# Patient Record
Sex: Female | Born: 1955 | Race: White | Hispanic: No | Marital: Married | Smoking: Former smoker
Health system: Southern US, Community
[De-identification: ages and names within clinical notes are randomized; demographics above are authoritative.]

## PROBLEM LIST (undated history)

## (undated) DIAGNOSIS — E079 Disorder of thyroid, unspecified: Secondary | ICD-10-CM

## (undated) DIAGNOSIS — Z8619 Personal history of other infectious and parasitic diseases: Secondary | ICD-10-CM

## (undated) DIAGNOSIS — R002 Palpitations: Secondary | ICD-10-CM

## (undated) DIAGNOSIS — I10 Essential (primary) hypertension: Secondary | ICD-10-CM

## (undated) DIAGNOSIS — E785 Hyperlipidemia, unspecified: Secondary | ICD-10-CM

## (undated) DIAGNOSIS — M25551 Pain in right hip: Secondary | ICD-10-CM

## (undated) DIAGNOSIS — M21611 Bunion of right foot: Secondary | ICD-10-CM

## (undated) DIAGNOSIS — K5792 Diverticulitis of intestine, part unspecified, without perforation or abscess without bleeding: Secondary | ICD-10-CM

## (undated) DIAGNOSIS — Z789 Other specified health status: Secondary | ICD-10-CM

## (undated) DIAGNOSIS — Z0000 Encounter for general adult medical examination without abnormal findings: Principal | ICD-10-CM

## (undated) DIAGNOSIS — N952 Postmenopausal atrophic vaginitis: Secondary | ICD-10-CM

## (undated) DIAGNOSIS — E559 Vitamin D deficiency, unspecified: Secondary | ICD-10-CM

## (undated) DIAGNOSIS — N951 Menopausal and female climacteric states: Secondary | ICD-10-CM

## (undated) DIAGNOSIS — Z531 Procedure and treatment not carried out because of patient's decision for reasons of belief and group pressure: Secondary | ICD-10-CM

## (undated) DIAGNOSIS — E663 Overweight: Secondary | ICD-10-CM

## (undated) DIAGNOSIS — M25512 Pain in left shoulder: Secondary | ICD-10-CM

## (undated) HISTORY — DX: Disorder of thyroid, unspecified: E07.9

## (undated) HISTORY — DX: Diverticulitis of intestine, part unspecified, without perforation or abscess without bleeding: K57.92

## (undated) HISTORY — DX: Encounter for general adult medical examination without abnormal findings: Z00.00

## (undated) HISTORY — DX: Essential (primary) hypertension: I10

## (undated) HISTORY — DX: Overweight: E66.3

## (undated) HISTORY — DX: Vitamin D deficiency, unspecified: E55.9

## (undated) HISTORY — PX: WISDOM TOOTH EXTRACTION: SHX21

## (undated) HISTORY — DX: Palpitations: R00.2

## (undated) HISTORY — DX: Hyperlipidemia, unspecified: E78.5

## (undated) HISTORY — DX: Postmenopausal atrophic vaginitis: N95.2

## (undated) HISTORY — DX: Personal history of other infectious and parasitic diseases: Z86.19

## (undated) HISTORY — DX: Other specified health status: Z78.9

## (undated) HISTORY — DX: Pain in left shoulder: M25.512

## (undated) HISTORY — PX: PARTIAL HIP ARTHROPLASTY: SHX733

## (undated) HISTORY — PX: COLONOSCOPY: SHX174

## (undated) HISTORY — DX: Bunion of right foot: M21.611

## (undated) HISTORY — DX: Procedure and treatment not carried out because of patient's decision for reasons of belief and group pressure: Z53.1

## (undated) HISTORY — DX: Pain in right hip: M25.551

## (undated) HISTORY — DX: Menopausal and female climacteric states: N95.1

---

## 2013-09-04 LAB — HM COLONOSCOPY

## 2014-10-20 LAB — HM MAMMOGRAPHY: HM MAMMO: NEGATIVE

## 2014-12-21 LAB — HM PAP SMEAR

## 2015-06-07 ENCOUNTER — Encounter: Payer: Self-pay | Admitting: Family Medicine

## 2015-06-07 ENCOUNTER — Ambulatory Visit (INDEPENDENT_AMBULATORY_CARE_PROVIDER_SITE_OTHER): Payer: No Typology Code available for payment source | Admitting: Family Medicine

## 2015-06-07 VITALS — BP 128/72 | HR 88 | Temp 98.6°F | Ht 67.0 in | Wt 190.0 lb

## 2015-06-07 DIAGNOSIS — R002 Palpitations: Secondary | ICD-10-CM

## 2015-06-07 DIAGNOSIS — Z8619 Personal history of other infectious and parasitic diseases: Secondary | ICD-10-CM | POA: Diagnosis not present

## 2015-06-07 DIAGNOSIS — E663 Overweight: Secondary | ICD-10-CM

## 2015-06-07 DIAGNOSIS — Z531 Procedure and treatment not carried out because of patient's decision for reasons of belief and group pressure: Secondary | ICD-10-CM

## 2015-06-07 DIAGNOSIS — IMO0001 Reserved for inherently not codable concepts without codable children: Secondary | ICD-10-CM

## 2015-06-07 DIAGNOSIS — I10 Essential (primary) hypertension: Secondary | ICD-10-CM | POA: Insufficient documentation

## 2015-06-07 DIAGNOSIS — E785 Hyperlipidemia, unspecified: Secondary | ICD-10-CM

## 2015-06-07 DIAGNOSIS — E559 Vitamin D deficiency, unspecified: Secondary | ICD-10-CM

## 2015-06-07 DIAGNOSIS — Z789 Other specified health status: Secondary | ICD-10-CM

## 2015-06-07 DIAGNOSIS — E079 Disorder of thyroid, unspecified: Secondary | ICD-10-CM | POA: Insufficient documentation

## 2015-06-07 DIAGNOSIS — M25551 Pain in right hip: Secondary | ICD-10-CM

## 2015-06-07 DIAGNOSIS — N951 Menopausal and female climacteric states: Secondary | ICD-10-CM

## 2015-06-07 DIAGNOSIS — Z780 Asymptomatic menopausal state: Secondary | ICD-10-CM

## 2015-06-07 DIAGNOSIS — R0789 Other chest pain: Secondary | ICD-10-CM

## 2015-06-07 HISTORY — DX: Other specified health status: Z78.9

## 2015-06-07 HISTORY — DX: Reserved for inherently not codable concepts without codable children: IMO0001

## 2015-06-07 HISTORY — DX: Procedure and treatment not carried out because of patient's decision for reasons of belief and group pressure: Z53.1

## 2015-06-07 HISTORY — DX: Vitamin D deficiency, unspecified: E55.9

## 2015-06-07 HISTORY — DX: Palpitations: R00.2

## 2015-06-07 HISTORY — DX: Personal history of other infectious and parasitic diseases: Z86.19

## 2015-06-07 HISTORY — DX: Overweight: E66.3

## 2015-06-07 NOTE — Assessment & Plan Note (Addendum)
Takes Vitamin D 5000 IU bid, will need level checked

## 2015-06-07 NOTE — Progress Notes (Signed)
Patient ID: Megan Shah, female   DOB: February 08, 1956, 59 y.o.   MRN: [id number]   Subjective:    Patient ID: Megan Shah, female    DOB: 13-Dec-1955, 59 y.o.   MRN: [id number]  Chief Complaint  Patient presents with  . Hip Pain  . Tachycardia    HPI Patient is in today for new patient appointment. She is back to the area to help care for her ailing and aging parents. She reports generally being in good health but does offer some concerns. One is of hot flashes and irritability for which she uses bioidentical hormones with good relief. She's been struggling with right hip and right knee pain and has gotten some relief with chiropractic care. No recent trauma or injury. She acknowledges significant stress and has had some episodes of awakening at night with tachycardia. Has also had some atypical anterior chest pains although she's had none recently. No recent acute illness. Denies SOB/HA/congestion/fevers/GI or GU c/o. Taking meds as prescribed  Past Medical History  Diagnosis Date  . Diverticulitis   . Patient is Jehovah's Witness 06/07/2015  . Transfusion of blood product refused for religious reason 06/07/2015  . Overweight 06/07/2015  . History of chicken pox 06/07/2015  . Hypertension   . Hyperlipidemia   . Thyroid disease   . Vitamin D deficiency 06/07/2015  . Palpitations 06/07/2015  . Right hip pain 06/13/2015  . Post menopausal syndrome 06/13/2015    History reviewed. No pertinent past surgical history.  Family History  Problem Relation Age of Onset  . Arthritis Mother   . Hypertension Mother   . Hyperlipidemia Mother   . Heart disease Mother     CAD with stents, afib  . Atrial fibrillation Mother     flutter  . Arthritis Father     broken hip, sepsis   . Cancer Father     colon cancer, mirkel cell carcinoma  . Bunion Father   . Dementia Father   . Atrial fibrillation Father   . Arthritis Maternal Grandmother   . Arthritis Maternal Grandfather   . Cancer  Maternal Grandfather 67    colon cancer  . Arthritis Paternal Grandmother   . Arthritis Paternal Grandfather   . Cancer Paternal Grandfather     colon cancer  . Colon polyps Brother     non cancer polyp  . Cancer Brother     lymphoma in remission  . Atrial fibrillation Brother   . Alcohol abuse Son     Social History   Social History  . Marital Status: Married    Spouse Name: N/A  . Number of Children: N/A  . Years of Education: N/A   Occupational History  . volunteer    Social History Main Topics  . Smoking status: Former Research scientist (life sciences)  . Smokeless tobacco: Not on file     Comment: Smoked for 2 years as a teenager.  . Alcohol Use: 0.0 oz/week    0 Standard drinks or equivalent per week  . Drug Use: No  . Sexual Activity: Yes     Comment: lives with husband, moved from [place], no dietary restrictions, minimizes wheat and dairy, volunteer work   Other Topics Concern  . Not on file   Social History Narrative    No outpatient prescriptions prior to visit.   No facility-administered medications prior to visit.    No Known Allergies  Review of Systems  Constitutional: Negative for fever and malaise/fatigue.  HENT: Negative for congestion.   Eyes: Negative  for discharge.  Respiratory: Negative for shortness of breath.   Cardiovascular: Negative for chest pain, palpitations and leg swelling.  Gastrointestinal: Negative for nausea and abdominal pain.  Genitourinary: Negative for dysuria.  Musculoskeletal: Negative for falls.  Skin: Negative for rash.  Neurological: Negative for loss of consciousness and headaches.  Endo/Heme/Allergies: Negative for environmental allergies.  Psychiatric/Behavioral: Negative for depression. The patient is not nervous/anxious.        Objective:    Physical Exam  BP 128/72 mmHg  Pulse 88  Temp(Src) 98.6 F (37 C) (Oral)  Ht 5\' 7"  (1.702 m)  Wt 190 lb (86.183 kg)  BMI 29.75 kg/m2  SpO2 95% Wt Readings from Last 3 Encounters:    06/07/15 190 lb (86.183 kg)     No results found for: WBC, HGB, HCT, PLT, GLUCOSE, CHOL, TRIG, HDL, LDLDIRECT, LDLCALC, ALT, AST, NA, K, CL, CREATININE, BUN, CO2, TSH, PSA, INR, GLUF, HGBA1C, MICROALBUR      Assessment & Plan:   Problem List Items Addressed This Visit    Vitamin D deficiency    Takes Vitamin D 5000 IU bid, will need level checked      Thyroid disease    Previously managed by [facility], will await lab work      Relevant Medications   thyroid (ARMOUR) 60 MG tablet   Right hip pain    And right knee pain has improved with help of Dr Luvenia Heller, chiropractor. encouraged topical treatments and staying active.       Post menopausal syndrome    Chooses to use bio identicals:  15 gm Bi-EST (50/50) 5mg /ml cream Sig: apply 1-2 clicks (1/7-6/1YW) qd, rotate sites  30 gm Progesterone 200 mg/cc (20%) cream Sig: apply 3 clicks (7.37TG)      Patient is Jehovah's Witness    Declines blood products. Encouraged to provide [place] with advanced directives.      Palpitations    Reports waking up with tachycardia infrequently, report return of symptoms. Minimize caffeine and maximize rest      Relevant Orders   EKG 12-Lead (Completed)   Ambulatory referral to Cardiology   Overweight    Encouraged DASH diet, decrease po intake and increase exercise as tolerated. Needs 7-8 hours of sleep nightly. Avoid trans fats, eat small, frequent meals every 4-5 hours with lean proteins, complex carbs and healthy fats. Minimize simple carbs, GMO foods.      Hypertension    Well controlled, no changes to meds. Encouraged heart healthy diet such as the DASH diet and exercise as tolerated.       Relevant Medications   bisoprolol-hydrochlorothiazide (ZIAC) 2.5-6.25 MG tablet   aspirin 81 MG chewable tablet   Hyperlipidemia    Encouraged heart healthy diet, increase exercise, avoid trans fats, consider a krill oil cap daily      Relevant Medications    bisoprolol-hydrochlorothiazide (ZIAC) 2.5-6.25 MG tablet   aspirin 81 MG chewable tablet   History of chicken pox   Atypical chest pain - Primary    Nothing recently but is new to area and describes occasional chest pain. EKG normal today. Will consider referral if symptoms return         I am having Ms. Robins maintain her thyroid, bisoprolol-hydrochlorothiazide, and aspirin.  Meds ordered this encounter  Medications  . thyroid (ARMOUR) 60 MG tablet    Sig: Take 60 mg by mouth daily.  . bisoprolol-hydrochlorothiazide (ZIAC) 2.5-6.25 MG tablet    Sig: Take 1 tablet by mouth daily.  Marland Kitchen  aspirin 81 MG chewable tablet    Sig: Chew 81 mg by mouth daily.     Penni Homans, MD

## 2015-06-07 NOTE — Patient Instructions (Signed)
Minimize caffeine and sodium, get regular exercise and 6-8 hours of sleep   Hypertension Hypertension, commonly called high blood pressure, is when the force of blood pumping through your arteries is too strong. Your arteries are the blood vessels that carry blood from your heart throughout your body. A blood pressure reading consists of a higher number over a lower number, such as 110/72. The higher number (systolic) is the pressure inside your arteries when your heart pumps. The lower number (diastolic) is the pressure inside your arteries when your heart relaxes. Ideally you want your blood pressure below 120/80. Hypertension forces your heart to work harder to pump blood. Your arteries may become narrow or stiff. Having hypertension puts you at risk for heart disease, stroke, and other problems.  RISK FACTORS Some risk factors for high blood pressure are controllable. Others are not.  Risk factors you cannot control include:   Race. You may be at higher risk if you are African American.  Age. Risk increases with age.  Gender. Men are at higher risk than women before age 65 years. After age 22, women are at higher risk than men. Risk factors you can control include:  Not getting enough exercise or physical activity.  Being overweight.  Getting too much fat, sugar, calories, or salt in your diet.  Drinking too much alcohol. SIGNS AND SYMPTOMS Hypertension does not usually cause signs or symptoms. Extremely high blood pressure (hypertensive crisis) may cause headache, anxiety, shortness of breath, and nosebleed. DIAGNOSIS  To check if you have hypertension, your health care provider will measure your blood pressure while you are seated, with your arm held at the level of your heart. It should be measured at least twice using the same arm. Certain conditions can cause a difference in blood pressure between your right and left arms. A blood pressure reading that is higher than normal on one  occasion does not mean that you need treatment. If one blood pressure reading is high, ask your health care provider about having it checked again. TREATMENT  Treating high blood pressure includes making lifestyle changes and possibly taking medicine. Living a healthy lifestyle can help lower high blood pressure. You may need to change some of your habits. Lifestyle changes may include:  Following the DASH diet. This diet is high in fruits, vegetables, and whole grains. It is low in salt, red meat, and added sugars.  Getting at least 2 hours of brisk physical activity every week.  Losing weight if necessary.  Not smoking.  Limiting alcoholic beverages.  Learning ways to reduce stress. If lifestyle changes are not enough to get your blood pressure under control, your health care provider may prescribe medicine. You may need to take more than one. Work closely with your health care provider to understand the risks and benefits. HOME CARE INSTRUCTIONS  Have your blood pressure rechecked as directed by your health care provider.   Take medicines only as directed by your health care provider. Follow the directions carefully. Blood pressure medicines must be taken as prescribed. The medicine does not work as well when you skip doses. Skipping doses also puts you at risk for problems.   Do not smoke.   Monitor your blood pressure at home as directed by your health care provider. SEEK MEDICAL CARE IF:   You think you are having a reaction to medicines taken.  You have recurrent headaches or feel dizzy.  You have swelling in your ankles.  You have trouble with your  vision. SEEK IMMEDIATE MEDICAL CARE IF:  You develop a severe headache or confusion.  You have unusual weakness, numbness, or feel faint.  You have severe chest or abdominal pain.  You vomit repeatedly.  You have trouble breathing. MAKE SURE YOU:   Understand these instructions.  Will watch your  condition.  Will get help right away if you are not doing well or get worse. Document Released: 08/21/2005 Document Revised: 01/05/2014 Document Reviewed: 06/13/2013 [facility] Patient Information 2015 [place], [place]. This information is not intended to replace advice given to you by your health care provider. Make sure you discuss any questions you have with your health care provider.

## 2015-06-07 NOTE — Progress Notes (Signed)
Pre visit review using our clinic review tool, if applicable. No additional management support is needed unless otherwise documented below in the visit note. 

## 2015-06-13 ENCOUNTER — Encounter: Payer: Self-pay | Admitting: Family Medicine

## 2015-06-13 DIAGNOSIS — M25551 Pain in right hip: Secondary | ICD-10-CM | POA: Insufficient documentation

## 2015-06-13 DIAGNOSIS — N951 Menopausal and female climacteric states: Secondary | ICD-10-CM

## 2015-06-13 HISTORY — DX: Pain in right hip: M25.551

## 2015-06-13 HISTORY — DX: Menopausal and female climacteric states: N95.1

## 2015-06-13 NOTE — Assessment & Plan Note (Signed)
Declines blood products. Encouraged to provide [place] with advanced directives.

## 2015-06-13 NOTE — Assessment & Plan Note (Signed)
Well controlled, no changes to meds. Encouraged heart healthy diet such as the DASH diet and exercise as tolerated.  °

## 2015-06-13 NOTE — Assessment & Plan Note (Signed)
Encouraged heart healthy diet, increase exercise, avoid trans fats, consider a krill oil cap daily 

## 2015-06-13 NOTE — Assessment & Plan Note (Signed)
Encouraged DASH diet, decrease po intake and increase exercise as tolerated. Needs 7-8 hours of sleep nightly. Avoid trans fats, eat small, frequent meals every 4-5 hours with lean proteins, complex carbs and healthy fats. Minimize simple carbs, GMO foods. 

## 2015-06-13 NOTE — Assessment & Plan Note (Signed)
Nothing recently but is new to area and describes occasional chest pain. EKG normal today. Will consider referral if symptoms return

## 2015-06-13 NOTE — Assessment & Plan Note (Signed)
Chooses to use bio identicals:  15 gm Bi-EST (50/50) 5mg /ml cream Sig: apply 1-2 clicks (3/8-2/5KN) qd, rotate sites  30 gm Progesterone 200 mg/cc (20%) cream Sig: apply 3 clicks (3.97QB)

## 2015-06-13 NOTE — Assessment & Plan Note (Signed)
And right knee pain has improved with help of Dr Luvenia Heller, chiropractor. encouraged topical treatments and staying active.

## 2015-06-13 NOTE — Assessment & Plan Note (Signed)
Previously managed by [facility], will await lab work

## 2015-06-13 NOTE — Assessment & Plan Note (Signed)
Reports waking up with tachycardia infrequently, report return of symptoms. Minimize caffeine and maximize rest

## 2015-09-13 ENCOUNTER — Ambulatory Visit: Payer: No Typology Code available for payment source | Admitting: Family Medicine

## 2015-11-03 ENCOUNTER — Encounter: Payer: Self-pay | Admitting: *Deleted

## 2015-11-03 ENCOUNTER — Telehealth: Payer: Self-pay | Admitting: *Deleted

## 2015-11-03 LAB — HEPATITIS C ANTIBODY: HEP C AB: NEGATIVE

## 2015-11-03 NOTE — Telephone Encounter (Signed)
Pre-Visit Call completed with patient and chart updated.   Pre-Visit Info documented in Specialty Comments under [employer].    

## 2015-11-03 NOTE — Telephone Encounter (Signed)
Pt not available at time of call. She is in the car with a group of people. Requests call back after 4pm.

## 2015-11-04 ENCOUNTER — Encounter (INDEPENDENT_AMBULATORY_CARE_PROVIDER_SITE_OTHER): Payer: Self-pay

## 2015-11-04 ENCOUNTER — Encounter: Payer: Self-pay | Admitting: Family Medicine

## 2015-11-04 ENCOUNTER — Ambulatory Visit (INDEPENDENT_AMBULATORY_CARE_PROVIDER_SITE_OTHER): Payer: No Typology Code available for payment source | Admitting: Family Medicine

## 2015-11-04 VITALS — BP 112/82 | HR 78 | Temp 97.4°F | Ht 67.0 in | Wt 192.0 lb

## 2015-11-04 DIAGNOSIS — E559 Vitamin D deficiency, unspecified: Secondary | ICD-10-CM | POA: Diagnosis not present

## 2015-11-04 DIAGNOSIS — E785 Hyperlipidemia, unspecified: Secondary | ICD-10-CM

## 2015-11-04 DIAGNOSIS — I10 Essential (primary) hypertension: Secondary | ICD-10-CM

## 2015-11-04 DIAGNOSIS — E039 Hypothyroidism, unspecified: Secondary | ICD-10-CM | POA: Diagnosis not present

## 2015-11-04 DIAGNOSIS — R1012 Left upper quadrant pain: Secondary | ICD-10-CM

## 2015-11-04 DIAGNOSIS — M25551 Pain in right hip: Secondary | ICD-10-CM

## 2015-11-04 NOTE — Progress Notes (Signed)
Pre visit review using our clinic review tool, if applicable. No additional management support is needed unless otherwise documented below in the visit note. 

## 2015-11-04 NOTE — Patient Instructions (Addendum)
Preventive Care for Adults, Female A healthy lifestyle and preventive care can promote health and wellness. Preventive health guidelines for women include the following key practices.  A routine yearly physical is a good way to check with your health care provider about your health and preventive screening. It is a chance to share any concerns and updates on your health and to receive a thorough exam.  Visit your dentist for a routine exam and preventive care every 6 months. Brush your teeth twice a day and floss once a day. Good oral hygiene prevents tooth decay and gum disease.  The frequency of eye exams is based on your age, health, family medical history, use of contact lenses, and other factors. Follow your health care provider's recommendations for frequency of eye exams.  Eat a healthy diet. Foods like vegetables, fruits, whole grains, low-fat dairy products, and lean protein foods contain the nutrients you need without too many calories. Decrease your intake of foods high in solid fats, added sugars, and salt. Eat the right amount of calories for you.Get information about a proper diet from your health care provider, if necessary.  Regular physical exercise is one of the most important things you can do for your health. Most adults should get at least 150 minutes of moderate-intensity exercise (any activity that increases your heart rate and causes you to sweat) each week. In addition, most adults need muscle-strengthening exercises on 2 or more days a week.  Maintain a healthy weight. The body mass index (BMI) is a screening tool to identify possible weight problems. It provides an estimate of body fat based on height and weight. Your health care provider can find your BMI and can help you achieve or maintain a healthy weight.For adults 20 years and older:  A BMI below 18.5 is considered underweight.  A BMI of 18.5 to 24.9 is normal.  A BMI of 25 to 29.9 is considered overweight.  A  BMI of 30 and above is considered obese.  Maintain normal blood lipids and cholesterol levels by exercising and minimizing your intake of saturated fat. Eat a balanced diet with plenty of fruit and vegetables. Blood tests for lipids and cholesterol should begin at age 20 and be repeated every 5 years. If your lipid or cholesterol levels are high, you are over 50, or you are at high risk for heart disease, you may need your cholesterol levels checked more frequently.Ongoing high lipid and cholesterol levels should be treated with medicines if diet and exercise are not working.  If you smoke, find out from your health care provider how to quit. If you do not use tobacco, do not start.  Lung cancer screening is recommended for adults aged 55-80 years who are at high risk for developing lung cancer because of a history of smoking. A yearly low-dose CT scan of the lungs is recommended for people who have at least a 30-pack-year history of smoking and are a current smoker or have quit within the past 15 years. A pack year of smoking is smoking an average of 1 pack of cigarettes a day for 1 year (for example: 1 pack a day for 30 years or 2 packs a day for 15 years). Yearly screening should continue until the smoker has stopped smoking for at least 15 years. Yearly screening should be stopped for people who develop a health problem that would prevent them from having lung cancer treatment.  If you are pregnant, do not drink alcohol. If you are   are breastfeeding, be very cautious about drinking alcohol. If you are not pregnant and choose to drink alcohol, do not have more than 1 drink per day. One drink is considered to be 12 ounces (355 mL) of beer, 5 ounces (148 mL) of wine, or 1.5 ounces (44 mL) of liquor.  Avoid use of street drugs. Do not share needles with anyone. Ask for help if you need support or instructions about stopping the use of drugs.  High blood pressure causes heart disease and  increases the risk of stroke. Your blood pressure should be checked at least every 1 to 2 years. Ongoing high blood pressure should be treated with medicines if weight loss and exercise do not work.  If you are 12-58 years old, ask your health care provider if you should take aspirin to prevent strokes.  Diabetes screening is done by taking a blood sample to check your blood glucose level after you have not eaten for a certain period of time (fasting). If you are not overweight and you do not have risk factors for diabetes, you should be screened once every 3 years starting at age 76. If you are overweight or obese and you are 18-1 years of age, you should be screened for diabetes every year as part of your cardiovascular risk assessment.  Breast cancer screening is essential preventive care for women. You should practice "breast self-awareness." This means understanding the normal appearance and feel of your breasts and may include breast self-examination. Any changes detected, no matter how small, should be reported to a health care provider. Women in their 73s and 30s should have a clinical breast exam (CBE) by a health care provider as part of a regular health exam every 1 to 3 years. After age 36, women should have a CBE every year. Starting at age 50, women should consider having a mammogram (breast X-ray test) every year. Women who have a family history of breast cancer should talk to their health care provider about genetic screening. Women at a high risk of breast cancer should talk to their health care providers about having an MRI and a mammogram every year.  Breast cancer gene (BRCA)-related cancer risk assessment is recommended for women who have family members with BRCA-related cancers. BRCA-related cancers include breast, ovarian, tubal, and peritoneal cancers. Having family members with these cancers may be associated with an increased risk for harmful changes (mutations) in the breast  cancer genes BRCA1 and BRCA2. Results of the assessment will determine the need for genetic counseling and BRCA1 and BRCA2 testing.  Your health care provider may recommend that you be screened regularly for cancer of the pelvic organs (ovaries, uterus, and vagina). This screening involves a pelvic examination, including checking for microscopic changes to the surface of your cervix (Pap test). You may be encouraged to have this screening done every 3 years, beginning at age 40.  For women ages 32-65, health care providers may recommend pelvic exams and Pap testing every 3 years, or they may recommend the Pap and pelvic exam, combined with testing for human papilloma virus (HPV), every 5 years. Some types of HPV increase your risk of cervical cancer. Testing for HPV may also be done on women of any age with unclear Pap test results.  Other health care providers may not recommend any screening for nonpregnant women who are considered low risk for pelvic cancer and who do not have symptoms. Ask your health care provider if a screening pelvic exam is right  you.  If you have had past treatment for cervical cancer or a condition that could lead to cancer, you need Pap tests and screening for cancer for at least 20 years after your treatment. If Pap tests have been discontinued, your risk factors (such as having a new sexual partner) need to be reassessed to determine if screening should resume. Some women have medical problems that increase the chance of getting cervical cancer. In these cases, your health care provider may recommend more frequent screening and Pap tests.  Colorectal cancer can be detected and often prevented. Most routine colorectal cancer screening begins at the age of 50 years and continues through age 75 years. However, your health care provider may recommend screening at an earlier age if you have risk factors for colon cancer. On a yearly basis, your health care provider may provide home test kits to check  for hidden blood in the stool. Use of a small camera at the end of a tube, to directly examine the colon (sigmoidoscopy or colonoscopy), can detect the earliest forms of colorectal cancer. Talk to your health care provider about this at age 50, when routine screening begins. Direct exam of the colon should be repeated every 5-10 years through age 75 years, unless early forms of precancerous polyps or small growths are found.  People who are at an increased risk for hepatitis B should be screened for this virus. You are considered at high risk for hepatitis B if:  You were born in a country where hepatitis B occurs often. Talk with your health care provider about which countries are considered high risk.  Your parents were born in a high-risk country and you have not received a shot to protect against hepatitis B (hepatitis B vaccine).  You have HIV or AIDS.  You use needles to inject street drugs.  You live with, or have sex with, someone who has hepatitis B.  You get hemodialysis treatment.  You take certain medicines for conditions like cancer, organ transplantation, and autoimmune conditions.  Hepatitis C blood testing is recommended for all people born from 1945 through 1965 and any individual with known risks for hepatitis C.  Practice safe sex. Use condoms and avoid high-risk sexual practices to reduce the spread of sexually transmitted infections (STIs). STIs include gonorrhea, chlamydia, syphilis, trichomonas, herpes, HPV, and human immunodeficiency virus (HIV). Herpes, HIV, and HPV are viral illnesses that have no cure. They can result in disability, cancer, and death.  You should be screened for sexually transmitted illnesses (STIs) including gonorrhea and chlamydia if:  You are sexually active and are younger than 24 years.  You are older than 24 years and your health care provider tells you that you are at risk for this type of infection.  Your sexual activity has changed  since you were last screened and you are at an increased risk for chlamydia or gonorrhea. Ask your health care provider if you are at risk.  If you are at risk of being infected with HIV, it is recommended that you take a prescription medicine daily to prevent HIV infection. This is called preexposure prophylaxis (PrEP). You are considered at risk if:  You are sexually active and do not regularly use condoms or know the HIV status of your partner(s).  You take drugs by injection.  You are sexually active with a partner who has HIV.  Talk with your health care provider about whether you are at high risk of being infected with HIV. If   you choose to begin PrEP, you should first be tested for HIV. You should then be tested every 3 months for as long as you are taking PrEP.  Osteoporosis is a disease in which the bones lose minerals and strength with aging. This can result in serious bone fractures or breaks. The risk of osteoporosis can be identified using a bone density scan. Women ages 65 years and over and women at risk for fractures or osteoporosis should discuss screening with their health care providers. Ask your health care provider whether you should take a calcium supplement or vitamin D to reduce the rate of osteoporosis.  Menopause can be associated with physical symptoms and risks. Hormone replacement therapy is available to decrease symptoms and risks. You should talk to your health care provider about whether hormone replacement therapy is right for you.  Use sunscreen. Apply sunscreen liberally and repeatedly throughout the day. You should seek shade when your shadow is shorter than you. Protect yourself by wearing long sleeves, pants, a wide-brimmed hat, and sunglasses year round, whenever you are outdoors.  Once a month, do a whole body skin exam, using a mirror to look at the skin on your back. Tell your health care provider of new moles, moles that have irregular borders, moles that  are larger than a pencil eraser, or moles that have changed in shape or color.  Stay current with required vaccines (immunizations).  Influenza vaccine. All adults should be immunized every year.  Tetanus, diphtheria, and acellular pertussis (Td, Tdap) vaccine. Pregnant women should receive 1 dose of Tdap vaccine during each pregnancy. The dose should be obtained regardless of the length of time since the last dose. Immunization is preferred during the 27th-36th week of gestation. An adult who has not previously received Tdap or who does not know her vaccine status should receive 1 dose of Tdap. This initial dose should be followed by tetanus and diphtheria toxoids (Td) booster doses every 10 years. Adults with an unknown or incomplete history of completing a 3-dose immunization series with Td-containing vaccines should begin or complete a primary immunization series including a Tdap dose. Adults should receive a Td booster every 10 years.  Varicella vaccine. An adult without evidence of immunity to varicella should receive 2 doses or a second dose if she has previously received 1 dose. Pregnant females who do not have evidence of immunity should receive the first dose after pregnancy. This first dose should be obtained before leaving the health care facility. The second dose should be obtained 4-8 weeks after the first dose.  Human papillomavirus (HPV) vaccine. Females aged 13-26 years who have not received the vaccine previously should obtain the 3-dose series. The vaccine is not recommended for use in pregnant females. However, pregnancy testing is not needed before receiving a dose. If a female is found to be pregnant after receiving a dose, no treatment is needed. In that case, the remaining doses should be delayed until after the pregnancy. Immunization is recommended for any person with an immunocompromised condition through the age of 26 years if she did not get any or all doses earlier. During the  3-dose series, the second dose should be obtained 4-8 weeks after the first dose. The third dose should be obtained 24 weeks after the first dose and 16 weeks after the second dose.  Zoster vaccine. One dose is recommended for adults aged 60 years or older unless certain conditions are present.  Measles, mumps, and rubella (MMR) vaccine. Adults born   born before [birth year] generally are considered immune to measles and mumps. Adults born in [birth year] or later should have 1 or more doses of MMR vaccine unless there is a contraindication to the vaccine or there is laboratory evidence of immunity to each of the three diseases. A routine second dose of MMR vaccine should be obtained at least 28 days after the first dose for students attending postsecondary schools, health care workers, or international travelers. People who received inactivated measles vaccine or an unknown type of measles vaccine during 1963-1967 should receive 2 doses of MMR vaccine. People who received inactivated mumps vaccine or an unknown type of mumps vaccine before 1979 and are at high risk for mumps infection should consider immunization with 2 doses of MMR vaccine. For females of childbearing age, rubella immunity should be determined. If there is no evidence of immunity, females who are not pregnant should be vaccinated. If there is no evidence of immunity, females who are pregnant should delay immunization until after pregnancy. Unvaccinated health care workers born before [birth year] who lack laboratory evidence of measles, mumps, or rubella immunity or laboratory confirmation of disease should consider measles and mumps immunization with 2 doses of MMR vaccine or rubella immunization with 1 dose of MMR vaccine.  Pneumococcal 13-valent conjugate (PCV13) vaccine. When indicated, a person who is uncertain of his immunization history and has no record of immunization should receive the PCV13 vaccine. All adults 62 years of age and older  should receive this vaccine. An adult aged 39 years or older who has certain medical conditions and has not been previously immunized should receive 1 dose of PCV13 vaccine. This PCV13 should be followed with a dose of pneumococcal polysaccharide (PPSV23) vaccine. Adults who are at high risk for pneumococcal disease should obtain the PPSV23 vaccine at least 8 weeks after the dose of PCV13 vaccine. Adults older than 60 years of age who have normal immune system function should obtain the PPSV23 vaccine dose at least 1 year after the dose of PCV13 vaccine.  Pneumococcal polysaccharide (PPSV23) vaccine. When PCV13 is also indicated, PCV13 should be obtained first. All adults aged 47 years and older should be immunized. An adult younger than age 27 years who has certain medical conditions should be immunized. Any person who resides in a nursing home or long-term care facility should be immunized. An adult smoker should be immunized. People with an immunocompromised condition and certain other conditions should receive both PCV13 and PPSV23 vaccines. People with human immunodeficiency virus (HIV) infection should be immunized as soon as possible after diagnosis. Immunization during chemotherapy or radiation therapy should be avoided. Routine use of PPSV23 vaccine is not recommended for American Indians, [place] Natives, or people younger than 65 years unless there are medical conditions that require PPSV23 vaccine. When indicated, people who have unknown immunization and have no record of immunization should receive PPSV23 vaccine. One-time revaccination 5 years after the first dose of PPSV23 is recommended for people aged 19-64 years who have chronic kidney failure, nephrotic syndrome, asplenia, or immunocompromised conditions. People who received 1-2 doses of PPSV23 before age 57 years should receive another dose of PPSV23 vaccine at age 42 years or later if at least 5 years have passed since the previous dose. Doses  of PPSV23 are not needed for people immunized with PPSV23 at or after age 51 years.  Meningococcal vaccine. Adults with asplenia or persistent complement component deficiencies should receive 2 doses of quadrivalent meningococcal conjugate (MenACWY-D) vaccine. The doses should be  at least 2 months apart. Microbiologists working with certain meningococcal bacteria, military recruits, people at risk during an outbreak, and people who travel to or live in countries with a high rate of meningitis should be immunized. A first-year college student up through age 21 years who is living in a residence hall should receive a dose if she did not receive a dose on or after her 16th birthday. Adults who have certain high-risk conditions should receive one or more doses of vaccine.  Hepatitis A vaccine. Adults who wish to be protected from this disease, have certain high-risk conditions, work with hepatitis A-infected animals, work in hepatitis A research labs, or travel to or work in countries with a high rate of hepatitis A should be immunized. Adults who were previously unvaccinated and who anticipate close contact with an international adoptee during the first 60 days after arrival in the [place] from a country with a high rate of hepatitis A should be immunized.  Hepatitis B vaccine. Adults who wish to be protected from this disease, have certain high-risk conditions, may be exposed to blood or other infectious body fluids, are household contacts or sex partners of hepatitis B positive people, are clients or workers in certain care facilities, or travel to or work in countries with a high rate of hepatitis B should be immunized.  Haemophilus influenzae type b (Hib) vaccine. A previously unvaccinated person with asplenia or sickle cell disease or having a scheduled splenectomy should receive 1 dose of Hib vaccine. Regardless of previous immunization, a recipient of a hematopoietic stem cell transplant should receive a  3-dose series 6-12 months after her successful transplant. Hib vaccine is not recommended for adults with HIV infection. Preventive Services / Frequency Ages 19 to 39 years  Blood pressure check.** / Every 3-5 years.  Lipid and cholesterol check.** / Every 5 years beginning at age 20.  Clinical breast exam.** / Every 3 years for women in their 20s and 30s.  BRCA-related cancer risk assessment.** / For women who have family members with a BRCA-related cancer (breast, ovarian, tubal, or peritoneal cancers).  Pap test.** / Every 2 years from ages 21 through 29. Every 3 years starting at age 30 through age 65 or 70 with a history of 3 consecutive normal Pap tests.  HPV screening.** / Every 3 years from ages 30 through ages 65 to 70 with a history of 3 consecutive normal Pap tests.  Hepatitis C blood test.** / For any individual with known risks for hepatitis C.  Skin self-exam. / Monthly.  Influenza vaccine. / Every year.  Tetanus, diphtheria, and acellular pertussis (Tdap, Td) vaccine.** / Consult your health care provider. Pregnant women should receive 1 dose of Tdap vaccine during each pregnancy. 1 dose of Td every 10 years.  Varicella vaccine.** / Consult your health care provider. Pregnant females who do not have evidence of immunity should receive the first dose after pregnancy.  HPV vaccine. / 3 doses over 6 months, if 26 and younger. The vaccine is not recommended for use in pregnant females. However, pregnancy testing is not needed before receiving a dose.  Measles, mumps, rubella (MMR) vaccine.** / You need at least 1 dose of MMR if you were born in 1957 or later. You may also need a 2nd dose. For females of childbearing age, rubella immunity should be determined. If there is no evidence of immunity, females who are not pregnant should be vaccinated. If there is no evidence of immunity, females who are   pregnant should delay immunization until after pregnancy.  Pneumococcal  13-valent conjugate (PCV13) vaccine.** / Consult your health care provider.  Pneumococcal polysaccharide (PPSV23) vaccine.** / 1 to 2 doses if you smoke cigarettes or if you have certain conditions.  Meningococcal vaccine.** / 1 dose if you are age 19 to 21 years and a first-year college student living in a residence hall, or have one of several medical conditions, you need to get vaccinated against meningococcal disease. You may also need additional booster doses.  Hepatitis A vaccine.** / Consult your health care provider.  Hepatitis B vaccine.** / Consult your health care provider.  Haemophilus influenzae type b (Hib) vaccine.** / Consult your health care provider. Ages 40 to 64 years  Blood pressure check.** / Every year.  Lipid and cholesterol check.** / Every 5 years beginning at age 20 years.  Lung cancer screening. / Every year if you are aged 55-80 years and have a 30-pack-year history of smoking and currently smoke or have quit within the past 15 years. Yearly screening is stopped once you have quit smoking for at least 15 years or develop a health problem that would prevent you from having lung cancer treatment.  Clinical breast exam.** / Every year after age 40 years.  BRCA-related cancer risk assessment.** / For women who have family members with a BRCA-related cancer (breast, ovarian, tubal, or peritoneal cancers).  Mammogram.** / Every year beginning at age 40 years and continuing for as long as you are in good health. Consult with your health care provider.  Pap test.** / Every 3 years starting at age 30 years through age 65 or 70 years with a history of 3 consecutive normal Pap tests.  HPV screening.** / Every 3 years from ages 30 years through ages 65 to 70 years with a history of 3 consecutive normal Pap tests.  Fecal occult blood test (FOBT) of stool. / Every year beginning at age 50 years and continuing until age 75 years. You may not need to do this test if you get  a colonoscopy every 10 years.  Flexible sigmoidoscopy or colonoscopy.** / Every 5 years for a flexible sigmoidoscopy or every 10 years for a colonoscopy beginning at age 50 years and continuing until age 75 years.  Hepatitis C blood test.** / For all people born from 1945 through 1965 and any individual with known risks for hepatitis C.  Skin self-exam. / Monthly.  Influenza vaccine. / Every year.  Tetanus, diphtheria, and acellular pertussis (Tdap/Td) vaccine.** / Consult your health care provider. Pregnant women should receive 1 dose of Tdap vaccine during each pregnancy. 1 dose of Td every 10 years.  Varicella vaccine.** / Consult your health care provider. Pregnant females who do not have evidence of immunity should receive the first dose after pregnancy.  Zoster vaccine.** / 1 dose for adults aged 60 years or older.  Measles, mumps, rubella (MMR) vaccine.** / You need at least 1 dose of MMR if you were born in 1957 or later. You may also need a second dose. For females of childbearing age, rubella immunity should be determined. If there is no evidence of immunity, females who are not pregnant should be vaccinated. If there is no evidence of immunity, females who are pregnant should delay immunization until after pregnancy.  Pneumococcal 13-valent conjugate (PCV13) vaccine.** / Consult your health care provider.  Pneumococcal polysaccharide (PPSV23) vaccine.** / 1 to 2 doses if you smoke cigarettes or if you have certain conditions.  Meningococcal vaccine.** /   Consult your health care provider.  Hepatitis A vaccine.** / Consult your health care provider.  Hepatitis B vaccine.** / Consult your health care provider.  Haemophilus influenzae type b (Hib) vaccine.** / Consult your health care provider. Ages 80 years and over  Blood pressure check.** / Every year.  Lipid and cholesterol check.** / Every 5 years beginning at age 62 years.  Lung cancer  screening. / Every year if you are aged 32-80 years and have a 30-pack-year history of smoking and currently smoke or have quit within the past 15 years. Yearly screening is stopped once you have quit smoking for at least 15 years or develop a health problem that would prevent you from having lung cancer treatment.  Clinical breast exam.** / Every year after age 61 years.  BRCA-related cancer risk assessment.** / For women who have family members with a BRCA-related cancer (breast, ovarian, tubal, or peritoneal cancers).  Mammogram.** / Every year beginning at age 39 years and continuing for as long as you are in good health. Consult with your health care provider.  Pap test.** / Every 3 years starting at age 85 years through age 74 or 72 years with 3 consecutive normal Pap tests. Testing can be stopped between 65 and 70 years with 3 consecutive normal Pap tests and no abnormal Pap or HPV tests in the past 10 years.  HPV screening.** / Every 3 years from ages 55 years through ages 67 or 77 years with a history of 3 consecutive normal Pap tests. Testing can be stopped between 65 and 70 years with 3 consecutive normal Pap tests and no abnormal Pap or HPV tests in the past 10 years.  Fecal occult blood test (FOBT) of stool. / Every year beginning at age 81 years and continuing until age 22 years. You may not need to do this test if you get a colonoscopy every 10 years.  Flexible sigmoidoscopy or colonoscopy.** / Every 5 years for a flexible sigmoidoscopy or every 10 years for a colonoscopy beginning at age 67 years and continuing until age 22 years.  Hepatitis C blood test.** / For all people born from [birth year] through 1965 and any individual with known risks for hepatitis C.  Osteoporosis screening.** / A one-time screening for women ages 8 years and over and women at risk for fractures or osteoporosis.  Skin self-exam. / Monthly.  Influenza vaccine. / Every year.  Tetanus, diphtheria, and  acellular pertussis (Tdap/Td) vaccine.** / 1 dose of Td every 10 years.  Varicella vaccine.** / Consult your health care provider.  Zoster vaccine.** / 1 dose for adults aged 56 years or older.  Pneumococcal 13-valent conjugate (PCV13) vaccine.** / Consult your health care provider.  Pneumococcal polysaccharide (PPSV23) vaccine.** / 1 dose for all adults aged 15 years and older.  Meningococcal vaccine.** / Consult your health care provider.  Hepatitis A vaccine.** / Consult your health care provider.  Hepatitis B vaccine.** / Consult your health care provider.  Haemophilus influenzae type b (Hib) vaccine.** / Consult your health care provider. ** Family history and personal history of risk and conditions may change your health care provider's recommendations.   This information is not intended to replace advice given to you by your health care provider. Make sure you discuss any questions you have with your health care provider.   Document Released: 10/17/2001 Document Revised: 09/11/2014 Document Reviewed: 01/16/2011 Elsevier Interactive Patient Education [employer].

## 2015-11-04 NOTE — Assessment & Plan Note (Signed)
With low back pain, posterior and anterior pain. Will proceed with xrays and PT

## 2015-11-04 NOTE — Assessment & Plan Note (Signed)
Check Vitamin D level 

## 2015-11-04 NOTE — Assessment & Plan Note (Signed)
Well controlled, no changes to meds. Encouraged heart healthy diet such as the DASH diet and exercise as tolerated.  °

## 2015-11-04 NOTE — Assessment & Plan Note (Signed)
Encouraged heart healthy diet, increase exercise, avoid trans fats, consider a krill oil cap daily 

## 2015-11-05 LAB — COMPREHENSIVE METABOLIC PANEL
ALK PHOS: 44 U/L (ref 39–117)
ALT: 21 U/L (ref 0–35)
AST: 22 U/L (ref 0–37)
Albumin: 5 g/dL (ref 3.5–5.2)
BILIRUBIN TOTAL: 0.6 mg/dL (ref 0.2–1.2)
BUN: 16 mg/dL (ref 6–23)
CALCIUM: 10.4 mg/dL (ref 8.4–10.5)
CO2: 26 meq/L (ref 19–32)
Chloride: 100 mEq/L (ref 96–112)
Creatinine, Ser: 0.67 mg/dL (ref 0.40–1.20)
GFR: 95.38 mL/min (ref >60.00)
Glucose, Bld: 101 mg/dL — ABNORMAL HIGH (ref 70–99)
Potassium: 4.5 mEq/L (ref 3.5–5.1)
Sodium: 136 mEq/L (ref 135–145)
TOTAL PROTEIN: 7.8 g/dL (ref 6.0–8.3)

## 2015-11-05 LAB — CBC
HCT: 42.9 % (ref 36.0–46.0)
HEMOGLOBIN: 14.7 g/dL (ref 12.0–15.0)
MCHC: 34.3 g/dL (ref 30.0–36.0)
MCV: 87.5 fl (ref 78.0–100.0)
PLATELETS: 312 10*3/uL (ref 150.0–400.0)
RBC: 4.9 Mil/uL (ref 3.87–5.11)
RDW: 13.3 % (ref 11.5–15.5)
WBC: 7.7 10*3/uL (ref 4.0–10.5)

## 2015-11-05 LAB — LIPID PANEL
CHOLESTEROL: 251 mg/dL — AB (ref 0–200)
HDL: 66.6 mg/dL (ref >39.00)
NONHDL: 184.55
TRIGLYCERIDES: 239 mg/dL — AB (ref 0.0–149.0)
Total CHOL/HDL Ratio: 4
VLDL: 47.8 mg/dL — ABNORMAL HIGH (ref 0.0–40.0)

## 2015-11-05 LAB — TSH: TSH: 1.08 u[IU]/mL (ref 0.35–4.50)

## 2015-11-05 LAB — T4, FREE: Free T4: 0.82 ng/dL (ref 0.60–1.60)

## 2015-11-05 LAB — LDL CHOLESTEROL, DIRECT: LDL DIRECT: 158 mg/dL

## 2015-11-05 LAB — AMYLASE: AMYLASE: 35 U/L (ref 27–131)

## 2015-11-05 LAB — VITAMIN D 25 HYDROXY (VIT D DEFICIENCY, FRACTURES): VITD: 63.54 ng/mL (ref 30.00–100.00)

## 2015-11-10 ENCOUNTER — Encounter: Payer: Self-pay | Admitting: Family Medicine

## 2015-11-14 NOTE — Progress Notes (Signed)
Subjective:    Patient ID: Megan Shah, female    DOB: 1955/11/14, 60 y.o.   MRN: [id number]  Chief Complaint  Patient presents with  . Establish Care    HPI Patient is in today for new patient appointment. No recent illness but does have a past medical history that includes hypertension, hyperlipidemia, vitamin D deficiency and thyroid disease. Does struggle with ongoing right hip pain and follows with a chiropractor, Dr Dalia Heading with some improvements. Denies CP/palp/SOB/HA/congestion/fevers/GI or GU c/o. Taking meds as prescribed  Past Medical History  Diagnosis Date  . Diverticulitis   . Patient is Jehovah's Witness 06/07/2015  . Transfusion of blood product refused for religious reason 06/07/2015  . Overweight 06/07/2015  . History of chicken pox 06/07/2015  . Hypertension   . Hyperlipidemia   . Thyroid disease   . Vitamin D deficiency 06/07/2015  . Palpitations 06/07/2015  . Right hip pain 06/13/2015  . Post menopausal syndrome 06/13/2015    Past Surgical History  Procedure Laterality Date  . Wisdom tooth extraction  Age 76    Family History  Problem Relation Age of Onset  . Arthritis Mother   . Hypertension Mother   . Hyperlipidemia Mother   . Heart disease Mother     CAD with stents, afib  . Atrial fibrillation Mother     flutter  . Arthritis Father     broken hip, sepsis   . Cancer Father     colon cancer, mirkel cell carcinoma  . Bunion Father   . Dementia Father   . Atrial fibrillation Father   . Arthritis Maternal Grandmother   . Arthritis Maternal Grandfather   . Cancer Maternal Grandfather 108    colon cancer  . Arthritis Paternal Grandmother   . Arthritis Paternal Grandfather   . Cancer Paternal Grandfather     colon cancer  . Colon polyps Brother     non cancer polyp  . Cancer Brother     lymphoma in remission  . Atrial fibrillation Brother   . Alcohol abuse Son     Social History   Social History  . Marital Status: Married    Spouse  Name: N/A  . Number of Children: N/A  . Years of Education: N/A   Occupational History  . volunteer    Social History Main Topics  . Smoking status: Former Research scientist (life sciences)  . Smokeless tobacco: Not on file     Comment: Smoked for 2 years as a teenager.  . Alcohol Use: 0.0 oz/week    0 Standard drinks or equivalent per week  . Drug Use: No  . Sexual Activity: Yes     Comment: lives with husband, moved from [place], no dietary restrictions, minimizes wheat and dairy, volunteer work   Other Topics Concern  . Not on file   Social History Narrative    Outpatient Prescriptions Prior to Visit  Medication Sig Dispense Refill  . Acetylcysteine (N-ACETYL-L-CYSTEINE PO) Take by mouth daily.    Marland Kitchen aspirin 81 MG chewable tablet Chew 81 mg by mouth daily.    . bisoprolol-hydrochlorothiazide (ZIAC) 2.5-6.25 MG tablet Take 1 tablet by mouth daily.    . Cholecalciferol (D 2000) 2000 units TABS Take 4,000 Units by mouth 2 (two) times daily.     Marland Kitchen CINNAMON PO Take by mouth.    . Iodine, Kelp, (KELP PO) Take by mouth.    . Multiple Vitamins-Minerals (MULTIVITAMIN ADULT PO) Take 1 tablet by mouth daily.    . NON FORMULARY  Apply topically.    . Omega-3 Fatty Acids (FISH OIL PO) Take by mouth.    . thyroid (ARMOUR) 60 MG tablet Take 60 mg by mouth daily.    . TURMERIC PO Take by mouth.     No facility-administered medications prior to visit.    No Known Allergies  Review of Systems  Constitutional: Negative for fever, chills and malaise/fatigue.  HENT: Negative for congestion and hearing loss.   Eyes: Negative for discharge.  Respiratory: Negative for cough, sputum production and shortness of breath.   Cardiovascular: Negative for chest pain, palpitations and leg swelling.  Gastrointestinal: Negative for heartburn, nausea, vomiting, abdominal pain, diarrhea, constipation and blood in stool.  Genitourinary: Negative for dysuria, urgency, frequency and hematuria.  Musculoskeletal: Positive for joint  pain. Negative for myalgias, back pain and falls.  Skin: Negative for rash.  Neurological: Negative for dizziness, sensory change, loss of consciousness, weakness and headaches.  Endo/Heme/Allergies: Negative for environmental allergies. Does not bruise/bleed easily.  Psychiatric/Behavioral: Negative for depression and suicidal ideas. The patient is not nervous/anxious and does not have insomnia.        Objective:    Physical Exam  Constitutional: She is oriented to person, place, and time. She appears well-developed and well-nourished. No distress.  HENT:  Head: Normocephalic and atraumatic.  Eyes: Conjunctivae are normal.  Neck: Neck supple. No thyromegaly present.  Cardiovascular: Normal rate, regular rhythm and normal heart sounds.   No murmur heard. Pulmonary/Chest: Effort normal and breath sounds normal. No respiratory distress.  Abdominal: Soft. Bowel sounds are normal. She exhibits no distension and no mass. There is no tenderness.  Musculoskeletal: She exhibits no edema.  Lymphadenopathy:    She has no cervical adenopathy.  Neurological: She is alert and oriented to person, place, and time.  Skin: Skin is warm and dry.  Psychiatric: She has a normal mood and affect. Her behavior is normal.    BP 112/82 mmHg  Pulse 78  Temp(Src) 97.4 F (36.3 C) (Oral)  Ht 5\' 7"  (1.702 m)  Wt 192 lb (87.091 kg)  BMI 30.06 kg/m2  SpO2 96% Wt Readings from Last 3 Encounters:  11/04/15 192 lb (87.091 kg)  06/07/15 190 lb (86.183 kg)     Lab Results  Component Value Date   WBC 7.7 11/04/2015   HGB 14.7 11/04/2015   HCT 42.9 11/04/2015   PLT 312.0 11/04/2015   GLUCOSE 101* 11/04/2015   CHOL 251* 11/04/2015   TRIG 239.0* 11/04/2015   HDL 66.60 11/04/2015   LDLDIRECT 158.0 11/04/2015   ALT 21 11/04/2015   AST 22 11/04/2015   NA 136 11/04/2015   K 4.5 11/04/2015   CL 100 11/04/2015   CREATININE 0.67 11/04/2015   BUN 16 11/04/2015   CO2 26 11/04/2015   TSH 1.08 11/04/2015      Lab Results  Component Value Date   TSH 1.08 11/04/2015   Lab Results  Component Value Date   WBC 7.7 11/04/2015   HGB 14.7 11/04/2015   HCT 42.9 11/04/2015   MCV 87.5 11/04/2015   PLT 312.0 11/04/2015   Lab Results  Component Value Date   NA 136 11/04/2015   K 4.5 11/04/2015   CO2 26 11/04/2015   GLUCOSE 101* 11/04/2015   BUN 16 11/04/2015   CREATININE 0.67 11/04/2015   BILITOT 0.6 11/04/2015   ALKPHOS 44 11/04/2015   AST 22 11/04/2015   ALT 21 11/04/2015   PROT 7.8 11/04/2015   ALBUMIN 5.0 11/04/2015   CALCIUM  10.4 11/04/2015   GFR 95.38 11/04/2015   Lab Results  Component Value Date   CHOL 251* 11/04/2015   Lab Results  Component Value Date   HDL 66.60 11/04/2015   No results found for: [facility] Lab Results  Component Value Date   TRIG 239.0* 11/04/2015   Lab Results  Component Value Date   CHOLHDL 4 11/04/2015   No results found for: HGBA1C     Assessment & Plan:   Problem List Items Addressed This Visit    Hyperlipidemia    Encouraged heart healthy diet, increase exercise, avoid trans fats, consider a krill oil cap daily      Relevant Orders   TSH (Completed)   CBC (Completed)   Comprehensive metabolic panel (Completed)   Lipid panel (Completed)   VITAMIN D 25 Hydroxy (Vit-D Deficiency, Fractures) (Completed)   Hypertension - Primary    Well controlled, no changes to meds. Encouraged heart healthy diet such as the DASH diet and exercise as tolerated.       Relevant Orders   TSH (Completed)   CBC (Completed)   Comprehensive metabolic panel (Completed)   Lipid panel (Completed)   VITAMIN D 25 Hydroxy (Vit-D Deficiency, Fractures) (Completed)   Amylase   Right hip pain    With low back pain, posterior and anterior pain. Will proceed with xrays and PT      Relevant Orders   DG Arthro Hip Right   Vitamin D deficiency    Check Vitamin D level      Relevant Orders   TSH (Completed)   CBC (Completed)   Comprehensive metabolic  panel (Completed)   Lipid panel (Completed)   VITAMIN D 25 Hydroxy (Vit-D Deficiency, Fractures) (Completed)    Other Visit Diagnoses    Hypothyroidism, unspecified hypothyroidism type        Relevant Orders    TSH (Completed)    CBC (Completed)    Comprehensive metabolic panel (Completed)    Lipid panel (Completed)    VITAMIN D 25 Hydroxy (Vit-D Deficiency, Fractures) (Completed)    T4, free (Completed)    Amylase    LUQ abdominal pain        Relevant Orders    Amylase (Completed)       I am having Ms. Eliot maintain her thyroid, bisoprolol-hydrochlorothiazide, aspirin, Cholecalciferol, NON FORMULARY, Acetylcysteine (N-ACETYL-L-CYSTEINE PO), Omega-3 Fatty Acids (FISH OIL PO), TURMERIC PO, CINNAMON PO, (Iodine, Kelp, (KELP PO)), Multiple Vitamins-Minerals (MULTIVITAMIN ADULT PO), DHEA, and Pregnenolone Micronized.  Meds ordered this encounter  Medications  . DHEA 10 MG CAPS    Sig: Take by mouth daily.  . Pregnenolone Micronized POWD    Sig:      Penni Homans, MD

## 2015-11-29 ENCOUNTER — Other Ambulatory Visit: Payer: Self-pay | Admitting: Family Medicine

## 2015-11-29 ENCOUNTER — Encounter: Payer: Self-pay | Admitting: Family Medicine

## 2015-11-29 DIAGNOSIS — E079 Disorder of thyroid, unspecified: Secondary | ICD-10-CM

## 2015-11-29 DIAGNOSIS — R1013 Epigastric pain: Secondary | ICD-10-CM

## 2015-11-29 DIAGNOSIS — R002 Palpitations: Secondary | ICD-10-CM

## 2015-11-30 ENCOUNTER — Other Ambulatory Visit (INDEPENDENT_AMBULATORY_CARE_PROVIDER_SITE_OTHER): Payer: No Typology Code available for payment source

## 2015-11-30 DIAGNOSIS — E079 Disorder of thyroid, unspecified: Secondary | ICD-10-CM

## 2015-11-30 DIAGNOSIS — I10 Essential (primary) hypertension: Secondary | ICD-10-CM

## 2015-11-30 DIAGNOSIS — R1013 Epigastric pain: Secondary | ICD-10-CM | POA: Diagnosis not present

## 2015-11-30 DIAGNOSIS — R002 Palpitations: Secondary | ICD-10-CM

## 2015-11-30 DIAGNOSIS — E039 Hypothyroidism, unspecified: Secondary | ICD-10-CM | POA: Diagnosis not present

## 2015-11-30 LAB — AMYLASE: AMYLASE: 38 U/L (ref 27–131)

## 2015-11-30 LAB — H. PYLORI ANTIBODY, IGG: H Pylori IgG: NEGATIVE

## 2015-11-30 LAB — T3, FREE: T3, Free: 4.5 pg/mL — ABNORMAL HIGH (ref 2.3–4.2)

## 2015-12-01 LAB — THYROID ANTIBODIES: Thyroperoxidase Ab SerPl-aCnc: 1 IU/mL (ref <9)

## 2015-12-02 ENCOUNTER — Other Ambulatory Visit: Payer: Self-pay | Admitting: Family Medicine

## 2016-01-18 ENCOUNTER — Other Ambulatory Visit: Payer: Self-pay | Admitting: Family Medicine

## 2016-01-18 ENCOUNTER — Ambulatory Visit (HOSPITAL_BASED_OUTPATIENT_CLINIC_OR_DEPARTMENT_OTHER)
Admission: RE | Admit: 2016-01-18 | Discharge: 2016-01-18 | Disposition: A | Discharge status: Home / Self Care | Payer: No Typology Code available for payment source | Source: Ambulatory Visit | Attending: Family Medicine | Admitting: Family Medicine

## 2016-01-18 DIAGNOSIS — M25552 Pain in left hip: Secondary | ICD-10-CM | POA: Insufficient documentation

## 2016-02-07 ENCOUNTER — Ambulatory Visit (INDEPENDENT_AMBULATORY_CARE_PROVIDER_SITE_OTHER): Payer: No Typology Code available for payment source | Admitting: Family Medicine

## 2016-02-07 ENCOUNTER — Encounter: Payer: Self-pay | Admitting: Family Medicine

## 2016-02-07 VITALS — BP 108/72 | HR 78 | Temp 98.3°F | Ht 67.0 in

## 2016-02-07 DIAGNOSIS — R0789 Other chest pain: Secondary | ICD-10-CM

## 2016-02-07 DIAGNOSIS — N952 Postmenopausal atrophic vaginitis: Secondary | ICD-10-CM

## 2016-02-07 DIAGNOSIS — M21611 Bunion of right foot: Secondary | ICD-10-CM

## 2016-02-07 DIAGNOSIS — M25551 Pain in right hip: Secondary | ICD-10-CM

## 2016-02-07 DIAGNOSIS — I10 Essential (primary) hypertension: Secondary | ICD-10-CM | POA: Diagnosis not present

## 2016-02-07 DIAGNOSIS — E663 Overweight: Secondary | ICD-10-CM

## 2016-02-07 DIAGNOSIS — E079 Disorder of thyroid, unspecified: Secondary | ICD-10-CM | POA: Diagnosis not present

## 2016-02-07 DIAGNOSIS — E559 Vitamin D deficiency, unspecified: Secondary | ICD-10-CM

## 2016-02-07 HISTORY — DX: Postmenopausal atrophic vaginitis: N95.2

## 2016-02-07 HISTORY — DX: Bunion of right foot: M21.611

## 2016-02-07 LAB — T4, FREE: Free T4: 0.83 ng/dL (ref 0.60–1.60)

## 2016-02-07 LAB — TSH: TSH: 0.71 u[IU]/mL (ref 0.35–4.50)

## 2016-02-07 LAB — T3, FREE: T3 FREE: 3.5 pg/mL (ref 2.3–4.2)

## 2016-02-07 MED ORDER — ESTROGENS, CONJUGATED 0.625 MG/GM VA CREA: 1.0000 | TOPICAL_CREAM | VAGINAL | Status: DC

## 2016-02-07 MED ORDER — THYROID 60 MG PO TABS: ORAL_TABLET | ORAL | Status: DC

## 2016-02-07 NOTE — Progress Notes (Signed)
Pre visit review using our clinic review tool, if applicable. No additional management support is needed unless otherwise documented below in the visit note. 

## 2016-02-07 NOTE — Assessment & Plan Note (Signed)
Well controlled, no changes to meds. Encouraged heart healthy diet such as the DASH diet and exercise as tolerated.  °

## 2016-02-07 NOTE — Assessment & Plan Note (Signed)
Encouraged DASH diet, decrease po intake and increase exercise as tolerated. Needs 7-8 hours of sleep nightly. Avoid trans fats, eat small, frequent meals every 4-5 hours with lean proteins, complex carbs and healthy fats. Minimize simple carbs 

## 2016-02-07 NOTE — Assessment & Plan Note (Signed)
Patient agrees to stop topical treatments and try Premarin cream to vaginal mucosa qod to see if that will manage her symptoms with less risk.

## 2016-02-07 NOTE — Assessment & Plan Note (Signed)
Left flank really, likely musculoskeletal and labs all normal. She will discuss her concerns with her chiropractor to make sure he agrees and try some weight loss, stretching and exercise, adjunstments. Let us know if symptoms worsen

## 2016-02-07 NOTE — Assessment & Plan Note (Signed)
Improved with chiropractic care but still persistent no changes

## 2016-02-07 NOTE — Progress Notes (Signed)
Patient ID: Megan Shah, female   DOB: 03-Sep-1956, 60 y.o.   MRN: [id number]   Subjective:    Patient ID: Megan Shah, female    DOB: 07-11-1956, 60 y.o.   MRN: [id number]  Chief Complaint  Patient presents with  . Follow-up    HPI Patient is in today for follow up accompanied by her husband. She is having worsening pain in bunion on right foot she has had it for years but now more symptomatic. It is making her favor that right hip which continues to cause her pain. No falls or injury. She is also noting trouble with fatigue and poor sleep. No other acute concern except frustratin with her weight, is about to restart [employer]. Denies CP/palp/SOB/HA/congestion/fevers/GI or GU c/o. Taking meds as prescribed. Does still note some sense of discomfort in left flank over lowest ribs.   Past Medical History  Diagnosis Date  . Diverticulitis   . Patient is Jehovah's Witness 06/07/2015  . Transfusion of blood product refused for religious reason 06/07/2015  . Overweight 06/07/2015  . History of chicken pox 06/07/2015  . Hypertension   . Hyperlipidemia   . Thyroid disease   . Vitamin D deficiency 06/07/2015  . Palpitations 06/07/2015  . Right hip pain 06/13/2015  . Post menopausal syndrome 06/13/2015  . Bunion of great toe of right foot 02/07/2016  . Atrophic vaginitis 02/07/2016    Past Surgical History  Procedure Laterality Date  . Wisdom tooth extraction  Age 38    Family History  Problem Relation Age of Onset  . Arthritis Mother   . Hypertension Mother   . Hyperlipidemia Mother   . Heart disease Mother     CAD with stents, afib  . Atrial fibrillation Mother     flutter  . Arthritis Father     broken hip, sepsis   . Cancer Father     colon cancer, mirkel cell carcinoma  . Bunion Father   . Dementia Father   . Atrial fibrillation Father   . Arthritis Maternal Grandmother   . Arthritis Maternal Grandfather   . Cancer Maternal Grandfather 27    colon cancer  .  Arthritis Paternal Grandmother   . Arthritis Paternal Grandfather   . Cancer Paternal Grandfather     colon cancer  . Colon polyps Brother     non cancer polyp  . Cancer Brother     lymphoma in remission  . Atrial fibrillation Brother   . Alcohol abuse Son     Social History   Social History  . Marital Status: Married    Spouse Name: N/A  . Number of Children: N/A  . Years of Education: N/A   Occupational History  . volunteer    Social History Main Topics  . Smoking status: Former Research scientist (life sciences)  . Smokeless tobacco: Not on file     Comment: Smoked for 2 years as a teenager.  . Alcohol Use: 0.0 oz/week    0 Standard drinks or equivalent per week  . Drug Use: No  . Sexual Activity: Yes     Comment: lives with husband, moved from [place], no dietary restrictions, minimizes wheat and dairy, volunteer work   Other Topics Concern  . Not on file   Social History Narrative    Outpatient Prescriptions Prior to Visit  Medication Sig Dispense Refill  . Acetylcysteine (N-ACETYL-L-CYSTEINE PO) Take by mouth daily.    Marland Kitchen aspirin 81 MG chewable tablet Chew 81 mg by mouth daily.    Marland Kitchen  Cholecalciferol (D 2000) 2000 units TABS Take 4,000 Units by mouth 2 (two) times daily.     Marland Kitchen DHEA 10 MG CAPS Take by mouth daily.    . Multiple Vitamins-Minerals (MULTIVITAMIN ADULT PO) Take 1 tablet by mouth daily.    . NON FORMULARY Apply topically.    . Omega-3 Fatty Acids (FISH OIL PO) Take by mouth.    . TURMERIC PO Take by mouth.    . bisoprolol-hydrochlorothiazide (ZIAC) 2.5-6.25 MG tablet 1/2 daily    . Pregnenolone Micronized POWD     . thyroid (ARMOUR) 60 MG tablet daily. Take 1 tablet by mouth 6 days a week.    Marland Kitchen CINNAMON PO Take by mouth.    . Iodine, Kelp, (KELP PO) Take by mouth.     No facility-administered medications prior to visit.    No Known Allergies  Review of Systems  Constitutional: Positive for malaise/fatigue. Negative for fever.  HENT: Negative for congestion.   Eyes:  Negative for blurred vision.  Respiratory: Negative for shortness of breath.   Cardiovascular: Negative for chest pain, palpitations and leg swelling.  Gastrointestinal: Negative for nausea, abdominal pain and blood in stool.  Genitourinary: Negative for dysuria and frequency.  Musculoskeletal: Positive for back pain and joint pain. Negative for falls.  Skin: Negative for rash.  Neurological: Negative for dizziness, loss of consciousness and headaches.  Endo/Heme/Allergies: Negative for environmental allergies.  Psychiatric/Behavioral: Negative for depression. The patient has insomnia. The patient is not nervous/anxious.        Objective:    Physical Exam  BP 108/72 mmHg  Pulse 78  Temp(Src) 98.3 F (36.8 C) (Oral)  Ht 5\' 7"  (1.702 m)  Wt   SpO2 94% Wt Readings from Last 3 Encounters:  11/04/15 192 lb (87.091 kg)  06/07/15 190 lb (86.183 kg)     Lab Results  Component Value Date   WBC 7.7 11/04/2015   HGB 14.7 11/04/2015   HCT 42.9 11/04/2015   PLT 312.0 11/04/2015   GLUCOSE 101* 11/04/2015   CHOL 251* 11/04/2015   TRIG 239.0* 11/04/2015   HDL 66.60 11/04/2015   LDLDIRECT 158.0 11/04/2015   ALT 21 11/04/2015   AST 22 11/04/2015   NA 136 11/04/2015   K 4.5 11/04/2015   CL 100 11/04/2015   CREATININE 0.67 11/04/2015   BUN 16 11/04/2015   CO2 26 11/04/2015   TSH 1.08 11/04/2015    Lab Results  Component Value Date   TSH 1.08 11/04/2015   Lab Results  Component Value Date   WBC 7.7 11/04/2015   HGB 14.7 11/04/2015   HCT 42.9 11/04/2015   MCV 87.5 11/04/2015   PLT 312.0 11/04/2015   Lab Results  Component Value Date   NA 136 11/04/2015   K 4.5 11/04/2015   CO2 26 11/04/2015   GLUCOSE 101* 11/04/2015   BUN 16 11/04/2015   CREATININE 0.67 11/04/2015   BILITOT 0.6 11/04/2015   ALKPHOS 44 11/04/2015   AST 22 11/04/2015   ALT 21 11/04/2015   PROT 7.8 11/04/2015   ALBUMIN 5.0 11/04/2015   CALCIUM 10.4 11/04/2015   GFR 95.38 11/04/2015   Lab  Results  Component Value Date   CHOL 251* 11/04/2015   Lab Results  Component Value Date   HDL 66.60 11/04/2015   No results found for: [facility] Lab Results  Component Value Date   TRIG 239.0* 11/04/2015   Lab Results  Component Value Date   CHOLHDL 4 11/04/2015   No results found  for: HGBA1C     Assessment & Plan:   Problem List Items Addressed This Visit    Vitamin D deficiency    Encourage daily supplements      Thyroid disease   Relevant Medications   thyroid (ARMOUR) 60 MG tablet   Other Relevant Orders   TSH   T4, free   T3, free   Right hip pain    Improved with chiropractic care but still persistent no changes      Overweight    Encouraged DASH diet, decrease po intake and increase exercise as tolerated. Needs 7-8 hours of sleep nightly. Avoid trans fats, eat small, frequent meals every 4-5 hours with lean proteins, complex carbs and healthy fats. Minimize simple carbs      Hypertension - Primary    Well controlled, no changes to meds. Encouraged heart healthy diet such as the DASH diet and exercise as tolerated.       Bunion of great toe of right foot   Relevant Orders   Ambulatory referral to Podiatry   Atypical chest pain    Left flank really, likely musculoskeletal and labs all normal. She will discuss her concerns with her chiropractor to make sure he agrees and try some weight loss, stretching and exercise, adjunstments. Let us know if symptoms worsen      Atrophic vaginitis    Patient agrees to stop topical treatments and try Premarin cream to vaginal mucosa qod to see if that will manage her symptoms with less risk.         I have discontinued Ms. Ortega's bisoprolol-hydrochlorothiazide, CINNAMON PO, (Iodine, Kelp, (KELP PO)), Pregnenolone Micronized, and EC-RX PROGESTERONE. I have also changed her thyroid. Additionally, I am having her start on conjugated estrogens. Lastly, I am having her maintain her aspirin, Cholecalciferol, NON  FORMULARY, Acetylcysteine (N-ACETYL-L-CYSTEINE PO), Omega-3 Fatty Acids (FISH OIL PO), TURMERIC PO, Multiple Vitamins-Minerals (MULTIVITAMIN ADULT PO), DHEA, and Magnesium Citrate.  Meds ordered this encounter  Medications  . Magnesium Citrate POWD    Sig: Take 615 mg by mouth 2 (two) times daily.  Marland Kitchen DISCONTD: Progesterone Micronized (EC-RX PROGESTERONE) 20 % CREA    Sig: Place onto the skin.  Marland Kitchen conjugated estrogens (PREMARIN) vaginal cream    Sig: Place 1 Applicatorful vaginally every other day.    Dispense:  42.5 g    Refill:  1  . thyroid (ARMOUR) 60 MG tablet    Sig: Take 1 tablet by mouth 5 days a week and 1/2 tab po on the other 2 days     Penni Homans, MD

## 2016-02-07 NOTE — Patient Instructions (Signed)
DASH Eating Plan  DASH stands for "Dietary Approaches to Stop Hypertension." The DASH eating plan is a healthy eating plan that has been shown to reduce high blood pressure (hypertension). Additional health benefits may include reducing the risk of type 2 diabetes mellitus, heart disease, and stroke. The DASH eating plan may also help with weight loss.  WHAT DO I NEED TO KNOW ABOUT THE DASH EATING PLAN?  For the DASH eating plan, you will follow these general guidelines:  · Choose foods with a percent daily value for sodium of less than 5% (as listed on the food label).  · Use salt-free seasonings or herbs instead of table salt or sea salt.  · Check with your health care provider or pharmacist before using salt substitutes.  · Eat lower-sodium products, often labeled as "lower sodium" or "no salt added."  · Eat fresh foods.  · Eat more vegetables, fruits, and low-fat dairy products.  · Choose whole grains. Look for the word "whole" as the first word in the ingredient list.  · Choose fish and skinless chicken or turkey more often than red meat. Limit fish, poultry, and meat to 6 oz (170 g) each day.  · Limit sweets, desserts, sugars, and sugary drinks.  · Choose heart-healthy fats.  · Limit cheese to 1 oz (28 g) per day.  · Eat more home-cooked food and less restaurant, buffet, and fast food.  · Limit fried foods.  · Cook foods using methods other than frying.  · Limit canned vegetables. If you do use them, rinse them well to decrease the sodium.  · When eating at a restaurant, ask that your food be prepared with less salt, or no salt if possible.  WHAT FOODS CAN I EAT?  Seek help from a dietitian for individual calorie needs.  Grains  Whole grain or whole wheat bread. Brown rice. Whole grain or whole wheat pasta. Quinoa, bulgur, and whole grain cereals. Low-sodium cereals. Corn or whole wheat flour tortillas. Whole grain cornbread. Whole grain crackers. Low-sodium crackers.  Vegetables  Fresh or frozen vegetables  (raw, steamed, roasted, or grilled). Low-sodium or reduced-sodium tomato and vegetable juices. Low-sodium or reduced-sodium tomato sauce and paste. Low-sodium or reduced-sodium canned vegetables.   Fruits  All fresh, canned (in natural juice), or frozen fruits.  Meat and Other Protein Products  Ground beef (85% or leaner), grass-fed beef, or beef trimmed of fat. Skinless chicken or turkey. Ground chicken or turkey. Pork trimmed of fat. All fish and seafood. Eggs. Dried beans, peas, or lentils. Unsalted nuts and seeds. Unsalted canned beans.  Dairy  Low-fat dairy products, such as skim or 1% milk, 2% or reduced-fat cheeses, low-fat ricotta or cottage cheese, or plain low-fat yogurt. Low-sodium or reduced-sodium cheeses.  Fats and Oils  Tub margarines without trans fats. Light or reduced-fat mayonnaise and salad dressings (reduced sodium). Avocado. Safflower, olive, or canola oils. Natural peanut or almond butter.  Other  Unsalted popcorn and pretzels.  The items listed above may not be a complete list of recommended foods or beverages. Contact your dietitian for more options.  WHAT FOODS ARE NOT RECOMMENDED?  Grains  White bread. White pasta. White rice. Refined cornbread. Bagels and croissants. Crackers that contain trans fat.  Vegetables  Creamed or fried vegetables. Vegetables in a cheese sauce. Regular canned vegetables. Regular canned tomato sauce and paste. Regular tomato and vegetable juices.  Fruits  Dried fruits. Canned fruit in light or heavy syrup. Fruit juice.  Meat and Other Protein   Products  Fatty cuts of meat. Ribs, chicken wings, bacon, sausage, bologna, salami, chitterlings, fatback, hot dogs, bratwurst, and packaged luncheon meats. Salted nuts and seeds. Canned beans with salt.  Dairy  Whole or 2% milk, cream, half-and-half, and cream cheese. Whole-fat or sweetened yogurt. Full-fat cheeses or blue cheese. Nondairy creamers and whipped toppings. Processed cheese, cheese spreads, or cheese  curds.  Condiments  Onion and garlic salt, seasoned salt, table salt, and sea salt. Canned and packaged gravies. Worcestershire sauce. Tartar sauce. Barbecue sauce. Teriyaki sauce. Soy sauce, including reduced sodium. Steak sauce. Fish sauce. Oyster sauce. Cocktail sauce. Horseradish. Ketchup and mustard. Meat flavorings and tenderizers. Bouillon cubes. Hot sauce. Tabasco sauce. Marinades. Taco seasonings. Relishes.  Fats and Oils  Butter, stick margarine, lard, shortening, ghee, and bacon fat. Coconut, palm kernel, or palm oils. Regular salad dressings.  Other  Pickles and olives. Salted popcorn and pretzels.  The items listed above may not be a complete list of foods and beverages to avoid. Contact your dietitian for more information.  WHERE CAN I FIND MORE INFORMATION?  [employer], [employer], [employer]: www.nhlbi.nih.gov/health/health-topics/topics/dash/     This information is not intended to replace advice given to you by your health care provider. Make sure you discuss any questions you have with your health care provider.     Document Released: 08/10/2011 Document Revised: 09/11/2014 Document Reviewed: 06/25/2013  Elsevier Interactive Patient Education ©2016 [employer]

## 2016-02-07 NOTE — Assessment & Plan Note (Signed)
Encourage daily supplements.  

## 2016-02-23 ENCOUNTER — Ambulatory Visit (HOSPITAL_BASED_OUTPATIENT_CLINIC_OR_DEPARTMENT_OTHER)
Admission: RE | Admit: 2016-02-23 | Discharge: 2016-02-23 | Disposition: A | Discharge status: Home / Self Care | Payer: No Typology Code available for payment source | Source: Ambulatory Visit | Attending: Podiatry | Admitting: Podiatry

## 2016-02-23 ENCOUNTER — Ambulatory Visit (INDEPENDENT_AMBULATORY_CARE_PROVIDER_SITE_OTHER): Payer: No Typology Code available for payment source | Admitting: Podiatry

## 2016-02-23 ENCOUNTER — Encounter: Payer: Self-pay | Admitting: Podiatry

## 2016-02-23 VITALS — BP 144/84 | HR 76 | Resp 18

## 2016-02-23 DIAGNOSIS — M2021 Hallux rigidus, right foot: Secondary | ICD-10-CM

## 2016-02-23 NOTE — Progress Notes (Signed)
   Subjective:    Patient ID: Megan Shah, female    DOB: February 23, 1956, 60 y.o.   MRN: [id number]  HPI  60 year old female presents the office with her husband for concerns of the dorsal bunion of the right foot which is been ongoing for about 10 years has been worsening. She states that she has pain with hiking, walking, running and she has pain when she wears certain shoes. She states that she wears a stiff soled shoe her pain is decreased her she is unable to wear running shoes given the pain in the constant that he never toe causing pain. She has tried orthotics previously with any relief. No recent injury or trauma. There he does get red and throbs and certain shoes on the bunion site. No other complaints at this time.  Review of Systems  All other systems reviewed and are negative.      Objective:   Physical Exam General: AAO x3, NAD  Dermatological: Skin is warm, dry and supple bilateral. Nails x 10 are well manicured; remaining integument appears unremarkable at this time. There are no open sores, no preulcerative lesions, no rash or signs of infection present.  Vascular: Dorsalis Pedis artery and Posterior Tibial artery pedal pulses are 2/4 bilateral with immedate capillary fill time. Pedal hair growth present.  There is no pain with calf compression, swelling, warmth, erythema.   Neruologic: Grossly intact via light touch bilateral. Vibratory intact via tuning fork bilateral. Protective threshold with Semmes Wienstein monofilament intact to all pedal sites bilateral.   Musculoskeletal: There is decreased range of motion of the right first MTPJ and dorsal prominence of the first metatarsal head. There is slight erythema along the dorsal prominence from irritation shoe gear. There is crepitation first MPJ range of motion. No other areas of tenderness to palpation. MMT 5/5, range of motion intact otherwise.  Gait: Unassisted, Nonantalgic.     Assessment & Plan:  60 year old  female right hallux rigidus -Treatment options discussed including all alternatives, risks, and complications -Etiology of symptoms were discussed -X-rays were obtained and reviewed with the patient. 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 -I discussed both conservative and surgical treatment options. At this point she states that she will do to proceed with surgery. I discussed various options including arthroplasty versus arthrodesis. Given her activity level would likely pursue first MTPJ arthrodesis versus hemi-implant. I believe the silicone implant to wear down quickly given her to the level. She'll consider her options. -Follow-up at her discretion.  Celesta Gentile, DPM

## 2016-02-23 NOTE — Patient Instructions (Signed)
First MTP Joint Fusion  Page Content  What is a first MTP joint fusion? Arthritis of the first metatarsophalangeal or MTP joint (the big joint of the big toe) can cause pain and swelling. This can lead to difficulty with shoewear and mild activity such as walking. Arthritis develops when the cartilage in the joint wears away and the two bones that make up the big toe joint rub against one another.  What are the goals of a first MTP joint fusion? The goal of this procedure is to join (fuse) bones together permanently. By doing this, the joint is gone and the arthritis pain  ? X-rays taken after a first MTP joint fusion showing placement of a plate and screw.    lessens.  What signs indicate surgery may be needed? The need for surgery depends on how bad the arthritis is. Surgery is recommended for those with pain in the big toe joint and stiffness in the toe. Some patients are unable to wear certain shoes (dress shoes, high heels and boots) and can't participate in activities due to pain. If the condition exists in both feet, the more painful foot is operated on first.  An orthopaedic foot and ankle surgeon can determine the severity of the condition. Before deciding on surgery, conservative treatment should be attempted. This includes changes in activity and footwear or steroid injections. Patients can also try wearing a shoe with a rounded bottom or using carbon shoe inserts that limit joint motion.  When should I avoid surgery? You should avoid surgery if you have an active infection or severe narrowing of the arteries. You must be able to manage a recovery period that can last up to six months.  General Details of Procedure The entire surgery is performed in about two hours and you may go home on the same day.  Specific Technique An incision is  made on top of the big toe. Remaining cartilage is cleared away to allow the two bones to heal together. Your orthopaedic surgeon may use a combination of tools to shape each bone for a perfect fit.  Once prepared, the two bones are positioned and a metal plate is placed to hold both bones together. An additional screw is driven across the joint for extra stability and compression, which stimulates healing. Also, two screws can be placed across the joint without using a plate. After the hardware is placed, the incision is closed with sutures and the foot is placed in a dressing or splint.  What happens after surgery? You will likely be examined at two weeks, six weeks, three months and six months. X-rays may be taken at each visit to evaluate the bone healing and the position of the big toe. Weight bearing status will be determined by your surgeon. After a first MTP fusion, you should not wear shoes that put extra stress on the joint.  Potential Complications There are complications that relate to surgery in general. These include the risks associated with anesthesia, infection, damage to nerves and blood vessels, and bleeding or blood clots.  Common complications specific to MTP fusion include poor or delayed bone healing, infection, and stiffness in neighboring joints. The metal plate used during surgery can sometimes cause irritation. In this case it can be removed after the bone has healed. Finally, scarring within the joint can limit neighboring tendons.         Stiff Big Toe (Hallux Rigidus) The most common site of arthritis in the foot is at the base  of the big toe. This joint is called the metatarsophalangeal, or MTP joint. It's important because it has to bend every time you take a step. If the joint starts to stiffen, walking can become painful and difficult.  In the MTP joint, as in any joint, the ends of the bones are covered by a smooth articular cartilage. If wear-and-tear or injury  damage the articular cartilage, the raw bone ends can rub together. A bone spur, or overgrowth, may develop on the top of the bone. This overgrowth can prevent the toe from bending as much as it needs to when you walk. The result is a stiff big toe, or hallux rigidus.  Hallux rigidus usually develops in adults between the ages of 56 and 29 years. No one knows why it appears in some people and not others. It may result from an injury to the toe that damages the articular cartilage or from differences in foot anatomy that increase stress on the joint.  Symptoms  Pain in the joint when you are active, especially as you push-off on the toes when you walk  Swelling around the joint  A bump, like a bunion or callus, that develops on the top of the foot  Stiffness in the great toe and an inability to bend it up or down  Top of page Diagnosis If you find it difficult to bend your toe up and down or find that you are walking on the outside of your foot because of pain in the toe, see your doctor right away. Hallux rigidus is easier to treat when the condition is caught early. If you wait until you see a bony bump on the top of your foot, the bone spurs will have already developed and the condition will be more difficult to treat.  Your physician will examine your foot and look for evidence of bone spurs. He or she may move the toe around to see how much motion is possible without pain. X-rays will show the location and size of any bone spurs, as well as the degree of degeneration in the joint space and cartilage.  Top of page Treatment  Nonsurgical Treatment  Pain relievers and anti-inflammatory medications such as ibuprofen may help reduce the swelling and ease the pain. Applying ice packs or taking contrast baths (described below) may also help reduce inflammation and control symptoms for a short period of time. But they aren't enough to stop the condition from progressing. Wearing a shoe with a large toe box  will reduce the pressure on the toe, and you will probably have to give up wearing high heels. Your doctor may recommend that you get a stiff-soled shoe with a rocker or roller bottom design and possibly even a steel shank or metal brace in the sole. This type of shoe supports the foot when you walk and reduces the amount of bend in the big toe.  A contrast bath uses alternating cold and hot water to reduce inflammation. You'll need two buckets, one with water as cold as you can tolerate and the other with water as warm as you can tolerate. Immerse your foot in the cold water for 30 seconds, then immediately place it in the hot water for 30 seconds. Continue to alternate between cold and hot for five minutes, ending in the cold water. You can do contrast baths up to three times a day. However, be careful to avoid extreme temperatures in the water, especially if your feet aren't very sensitive to  heat or cold.  Surgical Treatment  Cheilectomy (kI-lek'-toe-me)  This surgery is usually recommended when damage is mild or moderate. It involves removing the bone spurs as well as a portion of the foot bone, so the toe has more room to bend. The incision is made on the top of the foot. The toe and the operative site may remain swollen for several months after the operation, and you will have to wear a wooden-soled sandal for at least two weeks after the surgery. But most patients do experience long-term relief.  Arthrodesis (are-throw-dee'-sis)  Fusing the bones together (arthrodesis) is often recommended when the damage to the cartilage is severe. The damaged cartilage is removed and pins, screws, or a plate are used to fix the joint in a permanent position. Gradually, the bones grow together. This type of surgery means that you will not be able to bend the toe at all. However, it is the most reliable way to reduce pain in these severe cases.  For the first six weeks after surgery, you will have to wear a cast and  then use crutches for about another six weeks. You won't be able to wear high heels, and you may need to wear a shoe with a rocker-type sole.  Arthroplasty (are-throw-plas'-tee)  Older patients who place few functional demands on the feet may be candidates for joint replacement surgery. The joint surfaces are removed and an artificial joint is implanted. This procedure may relieve pain and preserve joint motion.

## 2016-02-24 DIAGNOSIS — M2021 Hallux rigidus, right foot: Secondary | ICD-10-CM | POA: Insufficient documentation

## 2016-05-12 ENCOUNTER — Ambulatory Visit: Payer: No Typology Code available for payment source | Admitting: Family Medicine

## 2016-09-19 ENCOUNTER — Other Ambulatory Visit: Payer: Self-pay | Admitting: Family Medicine

## 2016-09-19 MED ORDER — BISOPROLOL-HYDROCHLOROTHIAZIDE 5-6.25 MG PO TABS: 1.0000 | ORAL_TABLET | Freq: Every day | ORAL | 1 refills | Status: DC

## 2016-10-03 ENCOUNTER — Ambulatory Visit (INDEPENDENT_AMBULATORY_CARE_PROVIDER_SITE_OTHER): Payer: No Typology Code available for payment source | Admitting: Family Medicine

## 2016-10-03 VITALS — BP 116/74 | HR 65

## 2016-10-03 DIAGNOSIS — I10 Essential (primary) hypertension: Secondary | ICD-10-CM

## 2016-10-03 MED ORDER — BISOPROLOL-HYDROCHLOROTHIAZIDE 2.5-6.25 MG PO TABS: 1.0000 | ORAL_TABLET | Freq: Every day | ORAL | Status: DC

## 2016-10-03 NOTE — Progress Notes (Signed)
Pre visit review using our clinic tool,if applicable. No additional management support is needed unless otherwise documented below in the visit note.   Patient in for BP check per order from Dr. Charlett Blake due to d/c of Bisoprolol- Hctz. 5-6.25mg . Patient states she started medication back around 2 weeks ago.. BP=116/74  P =65.  Patient to restart Bisoprolol - Hctz 2.5-6.25 mg per her request.Ok'd by Dr. Charlett Blake as long as patient returns in 1 month to have BP check and CMP. Patient agreed.

## 2016-10-03 NOTE — Progress Notes (Signed)
RN blood pressure check note reviewed. Agree with documention and plan. 

## 2016-10-09 ENCOUNTER — Encounter: Payer: Self-pay | Admitting: Family Medicine

## 2016-10-10 ENCOUNTER — Other Ambulatory Visit: Payer: Self-pay | Admitting: Family Medicine

## 2016-10-10 MED ORDER — BISOPROLOL-HYDROCHLOROTHIAZIDE 2.5-6.25 MG PO TABS: 1.0000 | ORAL_TABLET | Freq: Every day | ORAL | 1 refills | Status: DC

## 2016-11-20 ENCOUNTER — Encounter: Payer: Self-pay | Admitting: Family Medicine

## 2016-12-25 ENCOUNTER — Encounter: Payer: Self-pay | Admitting: Family Medicine

## 2016-12-25 ENCOUNTER — Ambulatory Visit (INDEPENDENT_AMBULATORY_CARE_PROVIDER_SITE_OTHER): Payer: No Typology Code available for payment source | Admitting: Family Medicine

## 2016-12-25 VITALS — BP 125/76 | HR 72 | Temp 97.8°F | Ht 67.0 in | Wt 204.4 lb

## 2016-12-25 DIAGNOSIS — Z1231 Encounter for screening mammogram for malignant neoplasm of breast: Secondary | ICD-10-CM

## 2016-12-25 DIAGNOSIS — M21611 Bunion of right foot: Secondary | ICD-10-CM

## 2016-12-25 DIAGNOSIS — M2021 Hallux rigidus, right foot: Secondary | ICD-10-CM | POA: Diagnosis not present

## 2016-12-25 DIAGNOSIS — E782 Mixed hyperlipidemia: Secondary | ICD-10-CM | POA: Diagnosis not present

## 2016-12-25 DIAGNOSIS — Z0000 Encounter for general adult medical examination without abnormal findings: Secondary | ICD-10-CM

## 2016-12-25 DIAGNOSIS — L578 Other skin changes due to chronic exposure to nonionizing radiation: Secondary | ICD-10-CM | POA: Diagnosis not present

## 2016-12-25 DIAGNOSIS — M25512 Pain in left shoulder: Secondary | ICD-10-CM

## 2016-12-25 DIAGNOSIS — E079 Disorder of thyroid, unspecified: Secondary | ICD-10-CM | POA: Diagnosis not present

## 2016-12-25 DIAGNOSIS — R9431 Abnormal electrocardiogram [ECG] [EKG]: Secondary | ICD-10-CM

## 2016-12-25 DIAGNOSIS — Z1239 Encounter for other screening for malignant neoplasm of breast: Secondary | ICD-10-CM

## 2016-12-25 DIAGNOSIS — I10 Essential (primary) hypertension: Secondary | ICD-10-CM

## 2016-12-25 HISTORY — DX: Pain in left shoulder: M25.512

## 2016-12-25 HISTORY — DX: Encounter for general adult medical examination without abnormal findings: Z00.00

## 2016-12-25 MED ORDER — CYCLOBENZAPRINE HCL 10 MG PO TABS: 5.0000 mg | ORAL_TABLET | Freq: Two times a day (BID) | ORAL | 1 refills | Status: DC | PRN

## 2016-12-25 MED ORDER — BETAMETHASONE DIPROPIONATE 0.05 % EX LOTN: TOPICAL_LOTION | Freq: Two times a day (BID) | CUTANEOUS | 0 refills | Status: DC

## 2016-12-25 NOTE — Assessment & Plan Note (Signed)
Check TSH today

## 2016-12-25 NOTE — Assessment & Plan Note (Signed)
Patient encouraged to maintain heart healthy diet, regular exercise, adequate sleep. Consider daily probiotics. Take medications as prescribed 

## 2016-12-25 NOTE — Assessment & Plan Note (Signed)
Well controlled, no changes to meds. Encouraged heart healthy diet such as the DASH diet and exercise as tolerated. Check ekg today

## 2016-12-25 NOTE — Patient Instructions (Signed)

## 2016-12-25 NOTE — Assessment & Plan Note (Addendum)
Has an appointment for bunionectomy with her podiatrist in [place] on May 17. They have requested an EKG, CXR and labs. EKG shows LAE and with h/o HTN, will proceed cardiology consult for cardiac clearance. If all is normal then can clear. After review of CXR and labs patient is medically cleared to proceed with surgery

## 2016-12-25 NOTE — Assessment & Plan Note (Signed)
Bunion top of right great toe

## 2016-12-25 NOTE — Progress Notes (Signed)
Pre visit review using our clinic review tool, if applicable. No additional management support is needed unless otherwise documented below in the visit note. 

## 2016-12-25 NOTE — Progress Notes (Addendum)
Patient ID: Megan Shah, female   DOB: 01-06-1956, 61 y.o.   MRN: [id number]   Subjective:  CMA functioned as scribe during visit today  Patient ID: Megan Shah, female    DOB: 06-05-1956, 61 y.o.   MRN: [id number]  Chief Complaint  Patient presents with  . Annual Exam  . Hypertension    Hypertension  This is a chronic problem. The problem is controlled. Associated symptoms include malaise/fatigue. Pertinent negatives include no blurred vision, chest pain, headaches, palpitations or shortness of breath.  Shoulder Pain   The pain is present in the left shoulder. This is a new problem. The current episode started more than 1 month ago. The quality of the pain is described as dull and aching. The pain is moderate. Pertinent negatives include no fever.    Patient is in today for an annual examination. Patient states she has been experiencing some left shoulder pain for the past 2 months. States that the pain is dull, achy and she has been having problems lifting her shoulder. Also states that there is a spot on her chin that occasionally bleeds. Patient also states that she is to have bunion surgery on 01/18/2017. Patient has a Hx of HTN, hyperlipidemia, Vitamin D deficiency. Patient has no additional acute concerns noted at this time. She denies any injury or falls but her shoulder has been bothering her for months now. Her pain is going down in to upper arm and worse with rotation. No radiculopathy into hand. Denies CP/palp/SOB/HA/congestion/fevers/GI or GU c/o. Taking meds as prescribed  Patient Care Team: Mosie Lukes, MD as PCP - General (Family Medicine) Kerney Elbe, MD as Referring Physician (Family Medicine)   Past Medical History:  Diagnosis Date  . Atrophic vaginitis 02/07/2016  . Bunion of great toe of right foot 02/07/2016  . Diverticulitis   . History of chicken pox 06/07/2015  . Hyperlipidemia   . Hypertension   . Left shoulder pain 12/25/2016  . Overweight  06/07/2015  . Palpitations 06/07/2015  . Patient is Jehovah's Witness 06/07/2015  . Post menopausal syndrome 06/13/2015  . Preventative health care 12/25/2016  . Right hip pain 06/13/2015  . Thyroid disease   . Transfusion of blood product refused for religious reason 06/07/2015  . Vitamin D deficiency 06/07/2015    Past Surgical History:  Procedure Laterality Date  . WISDOM TOOTH EXTRACTION  Age 17    Family History  Problem Relation Age of Onset  . Arthritis Mother   . Hypertension Mother   . Hyperlipidemia Mother   . Heart disease Mother     CAD with stents, afib  . Atrial fibrillation Mother     flutter  . Arthritis Father     broken hip, sepsis   . Cancer Father     colon cancer, mirkel cell carcinoma  . Bunion Father   . Dementia Father   . Atrial fibrillation Father   . Arthritis Maternal Grandmother   . Arthritis Maternal Grandfather   . Cancer Maternal Grandfather 56    colon cancer  . Arthritis Paternal Grandmother   . Arthritis Paternal Grandfather   . Cancer Paternal Grandfather     colon cancer  . Colon polyps Brother     non cancer polyp  . Cancer Brother     lymphoma in remission  . Atrial fibrillation Brother   . Alcohol abuse Son     Social History   Social History  . Marital status: Married    Spouse name: N/A  .  Number of children: N/A  . Years of education: N/A   Occupational History  . volunteer    Social History Main Topics  . Smoking status: Former Research scientist (life sciences)  . Smokeless tobacco: Never Used     Comment: Smoked for 2 years as a teenager.  . Alcohol use 0.0 oz/week  . Drug use: No  . Sexual activity: Yes     Comment: lives with husband, moved from [place], no dietary restrictions, minimizes wheat and dairy, volunteer work   Other Topics Concern  . Not on file   Social History Narrative  . No narrative on file    Outpatient Medications Prior to Visit  Medication Sig Dispense Refill  . Acetylcysteine (N-ACETYL-L-CYSTEINE PO) Take  by mouth daily.    Marland Kitchen aspirin 81 MG chewable tablet Chew 81 mg by mouth daily.    . Cholecalciferol (D 2000) 2000 units TABS Take 4,000 Units by mouth 2 (two) times daily.     Marland Kitchen DHEA 10 MG CAPS Take by mouth daily.    . Magnesium Citrate POWD Take 615 mg by mouth 2 (two) times daily.    . Multiple Vitamins-Minerals (MULTIVITAMIN ADULT PO) Take 1 tablet by mouth daily.    . NON FORMULARY Apply topically.    . Omega-3 Fatty Acids (FISH OIL PO) Take by mouth.    . thyroid (ARMOUR) 60 MG tablet Take 1 tablet by mouth 5 days a week and 1/2 tab po on the other 2 days    . TURMERIC PO Take by mouth.    . conjugated estrogens (PREMARIN) vaginal cream Place 1 Applicatorful vaginally every other day. 42.5 g 1  . bisoprolol-hydrochlorothiazide (ZIAC) 2.5-6.25 MG tablet Take 1 tablet by mouth daily. (Patient not taking: Reported on 12/25/2016) 90 tablet 1   Facility-Administered Medications Prior to Visit  Medication Dose Route Frequency Provider Last Rate Last Dose  . bisoprolol-hydrochlorothiazide (ZIAC) 2.5-6.25 MG per tablet 1 tablet  1 tablet Oral Daily Mosie Lukes, MD        No Known Allergies  Review of Systems  Constitutional: Positive for malaise/fatigue. Negative for fever.  HENT: Negative for congestion.   Eyes: Negative for blurred vision.  Respiratory: Negative for cough and shortness of breath.   Cardiovascular: Negative for chest pain, palpitations and leg swelling.  Gastrointestinal: Negative for vomiting.  Musculoskeletal: Positive for joint pain. Negative for back pain.       L shoulder.  Skin: Negative for rash.  Neurological: Negative for loss of consciousness and headaches.       Objective:    Physical Exam  Constitutional: She is oriented to person, place, and time. She appears well-developed and well-nourished. No distress.  HENT:  Head: Normocephalic and atraumatic.  Eyes: Conjunctivae are normal.  Neck: Normal range of motion. No thyromegaly present.    Cardiovascular: Normal rate and regular rhythm.   Pulmonary/Chest: Effort normal and breath sounds normal. She has no wheezes.  Abdominal: Soft. Bowel sounds are normal. There is no tenderness.  Musculoskeletal: She exhibits no edema or deformity.  Neurological: She is alert and oriented to person, place, and time.  Skin: Skin is warm and dry. She is not diaphoretic.  Psychiatric: She has a normal mood and affect.    BP 125/76 (BP Location: Left Arm, Patient Position: Sitting, Cuff Size: Large)   Pulse 72   Temp 97.8 F (36.6 C) (Oral)   Ht 5\' 7"  (1.702 m)   Wt 204 lb 6.4 oz (92.7 kg)   BMI  32.01 kg/m  Wt Readings from Last 3 Encounters:  01/08/17 199 lb (90.3 kg)  12/28/16 204 lb (92.5 kg)  12/25/16 204 lb 6.4 oz (92.7 kg)   BP Readings from Last 3 Encounters:  01/08/17 120/70  12/28/16 136/88  12/25/16 125/76      There is no immunization history on file for this patient.  Health Maintenance  Topic Date Due  . HIV Screening  09/19/1970  . MAMMOGRAM  10/20/2016  . TETANUS/TDAP  11/02/2020 (Originally 09/19/1974)  . INFLUENZA VACCINE  04/04/2017  . PAP SMEAR  12/20/2017  . COLONOSCOPY  09/05/2023  . Hepatitis C Screening  Completed    Lab Results  Component Value Date   WBC 6.8 01/04/2017   HGB 14.4 01/04/2017   HCT 41.5 01/04/2017   PLT 324.0 01/04/2017   GLUCOSE 98 01/04/2017   CHOL 225 (H) 01/04/2017   TRIG 264.0 (H) 01/04/2017   HDL 59.20 01/04/2017   LDLDIRECT 136.0 01/04/2017   ALT 19 01/04/2017   AST 16 01/04/2017   NA 136 01/04/2017   K 3.9 01/04/2017   CL 101 01/04/2017   CREATININE 0.60 01/04/2017   BUN 12 01/04/2017   CO2 29 01/04/2017   TSH 0.62 01/04/2017    Lab Results  Component Value Date   TSH 0.62 01/04/2017   Lab Results  Component Value Date   WBC 6.8 01/04/2017   HGB 14.4 01/04/2017   HCT 41.5 01/04/2017   MCV 86.5 01/04/2017   PLT 324.0 01/04/2017   Lab Results  Component Value Date   NA 136 01/04/2017   K 3.9  01/04/2017   CO2 29 01/04/2017   GLUCOSE 98 01/04/2017   BUN 12 01/04/2017   CREATININE 0.60 01/04/2017   BILITOT 0.6 01/04/2017   ALKPHOS 56 01/04/2017   AST 16 01/04/2017   ALT 19 01/04/2017   PROT 7.5 01/04/2017   ALBUMIN 4.7 01/04/2017   CALCIUM 10.1 01/04/2017   GFR 107.92 01/04/2017   Lab Results  Component Value Date   CHOL 225 (H) 01/04/2017   Lab Results  Component Value Date   HDL 59.20 01/04/2017   No results found for: [facility] Lab Results  Component Value Date   TRIG 264.0 (H) 01/04/2017   Lab Results  Component Value Date   CHOLHDL 4 01/04/2017   No results found for: HGBA1C       Assessment & Plan:   Problem List Items Addressed This Visit    Hypertension    Well controlled, no changes to meds. Encouraged heart healthy diet such as the DASH diet and exercise as tolerated. Check ekg today      Relevant Medications   bisoprolol-hydrochlorothiazide (ZIAC) 5-6.25 MG tablet   Other Relevant Orders   CBC (Completed)   Comprehensive metabolic panel (Completed)   TSH (Completed)   DG Chest 2 View (Completed)   EKG 12-Lead (Completed)   Ambulatory referral to Cardiology   Hyperlipidemia    Encouraged heart healthy diet, increase exercise, avoid trans fats, consider a krill oil cap daily      Relevant Medications   bisoprolol-hydrochlorothiazide (ZIAC) 5-6.25 MG tablet   Other Relevant Orders   Lipid panel (Completed)   Ambulatory referral to Cardiology   Thyroid disease    Check TSH today      Bunion of great toe of right foot    Has an appointment for bunionectomy with her podiatrist in [place] on May 17. They have requested an EKG, CXR and labs. EKG shows LAE  and with h/o HTN, will proceed cardiology consult for cardiac clearance. If all is normal then can clear. After review of CXR and labs patient is medically cleared to proceed with surgery      RESOLVED: Hallux rigidus of right foot    Bunion top of right great toe      Left  shoulder pain   Relevant Orders   Ambulatory referral to Sports Medicine   Preventative health care - Primary    Patient encouraged to maintain heart healthy diet, regular exercise, adequate sleep. Consider daily probiotics. Take medications as prescribed      Relevant Orders   CBC (Completed)   Comprehensive metabolic panel (Completed)   Lipid panel (Completed)   TSH (Completed)    Other Visit Diagnoses    Breast cancer screening       Sun-damaged skin       Relevant Orders   Ambulatory referral to Dermatology   EKG, abnormal       Relevant Orders   Ambulatory referral to Cardiology      I am having Ms. Deery start on betamethasone dipropionate and cyclobenzaprine. I am also having her maintain her aspirin, Cholecalciferol, NON FORMULARY, Acetylcysteine (N-ACETYL-L-CYSTEINE PO), Omega-3 Fatty Acids (FISH OIL PO), TURMERIC PO, Multiple Vitamins-Minerals (MULTIVITAMIN ADULT PO), DHEA, Magnesium Citrate, conjugated estrogens, thyroid, and bisoprolol-hydrochlorothiazide. We will continue to administer bisoprolol-hydrochlorothiazide.  Meds ordered this encounter  Medications  . bisoprolol-hydrochlorothiazide (ZIAC) 5-6.25 MG tablet    Sig: Take 1 tablet by mouth daily.  Marland Kitchen DISCONTD: bisoprolol-hydrochlorothiazide (ZIAC) 5-6.25 MG tablet    Sig: Take by mouth.  . betamethasone dipropionate 0.05 % lotion    Sig: Apply topically 2 (two) times daily.    Dispense:  60 mL    Refill:  0  . cyclobenzaprine (FLEXERIL) 10 MG tablet    Sig: Take 0.5-1 tablets (5-10 mg total) by mouth 2 (two) times daily as needed for muscle spasms.    Dispense:  30 tablet    Refill:  1    CMA served as scribe during this visit. History, Physical and Plan performed by medical provider. Documentation and orders reviewed and attested to.  Penni Homans, MD

## 2016-12-25 NOTE — Assessment & Plan Note (Signed)
Encouraged heart healthy diet, increase exercise, avoid trans fats, consider a krill oil cap daily 

## 2016-12-26 ENCOUNTER — Encounter: Payer: Self-pay | Admitting: Family Medicine

## 2016-12-26 ENCOUNTER — Other Ambulatory Visit: Payer: Self-pay | Admitting: Family Medicine

## 2016-12-26 DIAGNOSIS — Z1231 Encounter for screening mammogram for malignant neoplasm of breast: Secondary | ICD-10-CM

## 2016-12-28 ENCOUNTER — Encounter: Payer: Self-pay | Admitting: Physician Assistant

## 2016-12-28 ENCOUNTER — Ambulatory Visit (INDEPENDENT_AMBULATORY_CARE_PROVIDER_SITE_OTHER): Payer: No Typology Code available for payment source | Admitting: Physician Assistant

## 2016-12-28 VITALS — BP 136/88 | HR 82 | Ht 67.0 in | Wt 204.0 lb

## 2016-12-28 DIAGNOSIS — R600 Localized edema: Secondary | ICD-10-CM

## 2016-12-28 DIAGNOSIS — R011 Cardiac murmur, unspecified: Secondary | ICD-10-CM | POA: Diagnosis not present

## 2016-12-28 DIAGNOSIS — E782 Mixed hyperlipidemia: Secondary | ICD-10-CM | POA: Diagnosis not present

## 2016-12-28 DIAGNOSIS — I10 Essential (primary) hypertension: Secondary | ICD-10-CM

## 2016-12-28 DIAGNOSIS — R002 Palpitations: Secondary | ICD-10-CM

## 2016-12-28 DIAGNOSIS — Z01810 Encounter for preprocedural cardiovascular examination: Secondary | ICD-10-CM

## 2016-12-28 NOTE — Patient Instructions (Addendum)
Medication Instructions:  Your physician recommends that you continue on your current medications as directed. Please refer to the Current Medication list given to you today.   Labwork: None ordered  Testing/Procedures: None ordered    Follow-Up: As needed with Dr. Acie Fredrickson   Any Other Special Instructions Will Be Listed Below (If Applicable).  Cleared for surgery   If you need a refill on your cardiac medications before your next appointment, please call your pharmacy.

## 2016-12-28 NOTE — Progress Notes (Signed)
Cardiology Office Note    Date:  12/28/2016   ID:  Megan Shah, DOB 1956-06-08, MRN [id number]  PCP:  Penni Homans, MD  Cardiologist: New to Dr. Acie Fredrickson   Chief Complaint: Surgical clearance for bunionectomy of right great toe on 01/18/17  History of Present Illness:   Megan Shah is a 61 y.o. female with hx of HTN, HLD, palpitations ,Jehovah's witness and  thyroid disease present for surgical clearance per request of Dr. Charlett Blake.   The patient is scheduled to have bunionectomy with her podiatrist in [place] on May 17. Requested a clearance from primary care provider. Seen by Dr. Charlett Blake 12/25/16 and noted EKG changes. Refer to cardiology for further evaluation. Pending CMP, CBC, TSH and lipid panel.   EKG 12/25/16: Normal sinus rhythm with evidence of left atrial enlargement. No acute ST or T wave abnormality.  Hx of palpitations dating back to her 51s. Prior monitor did not showed any arrhthymias. Drinks 3-4 cups of coffee everyday. Her palpitations occurs one or twice every six moths and self resolved within 1 minutes. No other associated symptoms. Has chronic trace edema however eats lots of salt. Intermittent feeling of some "squeezing in left chest". No syncope, dizziness, orthopnea, PND, headache or shortness of breath.   Father has afib. Mother has aflutter. Brother has afib.   Past Medical History:  Diagnosis Date  . Atrophic vaginitis 02/07/2016  . Bunion of great toe of right foot 02/07/2016  . Diverticulitis   . History of chicken pox 06/07/2015  . Hyperlipidemia   . Hypertension   . Left shoulder pain 12/25/2016  . Overweight 06/07/2015  . Palpitations 06/07/2015  . Patient is Jehovah's Witness 06/07/2015  . Post menopausal syndrome 06/13/2015  . Preventative health care 12/25/2016  . Right hip pain 06/13/2015  . Thyroid disease   . Transfusion of blood product refused for religious reason 06/07/2015  . Vitamin D deficiency 06/07/2015    Past Surgical History:    Procedure Laterality Date  . WISDOM TOOTH EXTRACTION  Age 44    Current Medications:  Prior to Admission medications   Medication Sig Start Date End Date Taking? Authorizing Provider  Acetylcysteine (N-ACETYL-L-CYSTEINE PO) Take by mouth daily.   Yes Historical Provider, MD  aspirin 81 MG chewable tablet Chew 81 mg by mouth daily.   Yes Historical Provider, MD  betamethasone dipropionate 0.05 % lotion Apply topically 2 (two) times daily. 12/25/16  Yes Mosie Lukes, MD  bisoprolol-hydrochlorothiazide [facility]) 5-6.25 MG tablet Take 1 tablet by mouth daily.   Yes Historical Provider, MD  Cholecalciferol (D 2000) 2000 units TABS Take 4,000 Units by mouth 2 (two) times daily.    Yes Historical Provider, MD  conjugated estrogens (PREMARIN) vaginal cream Place 1 Applicatorful vaginally every other day. 02/07/16  Yes Mosie Lukes, MD  cyclobenzaprine (FLEXERIL) 10 MG tablet Take 0.5-1 tablets (5-10 mg total) by mouth 2 (two) times daily as needed for muscle spasms. 12/25/16  Yes Mosie Lukes, MD  DHEA 10 MG CAPS Take by mouth daily.   Yes Historical Provider, MD  Magnesium Citrate POWD Take 615 mg by mouth 2 (two) times daily.   Yes Historical Provider, MD  Multiple Vitamins-Minerals (MULTIVITAMIN ADULT PO) Take 1 tablet by mouth daily.   Yes Historical Provider, MD  NON FORMULARY Apply topically.   Yes Historical Provider, MD  Omega-3 Fatty Acids (FISH OIL PO) Take by mouth.   Yes Historical Provider, MD  thyroid (ARMOUR) 60 MG tablet Take 1 tablet  by mouth 5 days a week and 1/2 tab po on the other 2 days 02/07/16  Yes Mosie Lukes, MD  TURMERIC PO Take by mouth.   Yes Historical Provider, MD    Allergies:   Patient has no known allergies.   Social History   Social History  . Marital status: Married    Spouse name: N/A  . Number of children: N/A  . Years of education: N/A   Occupational History  . volunteer    Social History Main Topics  . Smoking status: Former Research scientist (life sciences)  . Smokeless  tobacco: Never Used     Comment: Smoked for 2 years as a teenager.  . Alcohol use 0.0 oz/week  . Drug use: No  . Sexual activity: Yes     Comment: lives with husband, moved from [place], no dietary restrictions, minimizes wheat and dairy, volunteer work   Other Topics Concern  . None   Social History Narrative  . None     Family History:  The patient's family history includes Alcohol abuse in her son; Arthritis in her father, maternal grandfather, maternal grandmother, mother, paternal grandfather, and paternal grandmother; Atrial fibrillation in her brother, father, and mother; Bunion in her father; Cancer in her brother, father, and paternal grandfather; Cancer (age of onset: 32) in her maternal grandfather; Colon polyps in her brother; Dementia in her father; Heart disease in her mother; Hyperlipidemia in her mother; Hypertension in her mother.   ROS:   Please see the history of present illness.    ROS All other systems reviewed and are negative.   PHYSICAL EXAM:   VS:  BP 136/88   Pulse 82   Ht 5\' 7"  (1.702 m)   Wt 204 lb (92.5 kg)   SpO2 98%   BMI 31.95 kg/m    GEN: Well nourished, well developed, in no acute distress  HEENT: normal  Neck: no JVD, carotid bruits, or masses Cardiac: RRR; soft ystolic  murmurs, rubs, or gallops, Trace ankle edema  Respiratory:  clear to auscultation bilaterally, normal work of breathing GI: soft, nontender, nondistended, + BS MS: no deformity or atrophy  Skin: warm and dry, no rash Neuro:  Alert and Oriented x 3, Strength and sensation are intact Psych: euthymic mood, full affect  Wt Readings from Last 3 Encounters:  12/28/16 204 lb (92.5 kg)  12/25/16 204 lb 6.4 oz (92.7 kg)  11/04/15 192 lb (87.1 kg)      Studies/Labs Reviewed:   EKG:  EKG is not ordered today.    Recent Labs: 02/07/2016: TSH 0.71   Lipid Panel    Component Value Date/Time   CHOL 251 (H) 11/04/2015 1440   TRIG 239.0 (H) 11/04/2015 1440   HDL 66.60  11/04/2015 1440   CHOLHDL 4 11/04/2015 1440   VLDL 47.8 (H) 11/04/2015 1440   LDLDIRECT 158.0 11/04/2015 1440    Additional studies/ records that were reviewed today include:   AS above  ASSESSMENT & PLAN:    1. Palpitations - Her palpitation episode occurs very rare and short-lived. Advised to cut back on her caffeine intake. She does have a strong family history of arrhythmia as noted above. Discussed possible monitor however, her symptoms is seldom.. She doesn't want to proceed with any further evaluation currently. She will let us know if worsening of symptoms. Pending TSH checked by PCP.    2. Systolic murmur - seems soft flow murmer. Asymptomatic. No work up needed.   3. HTN - well controlled on  current medications  4. LE edema - No sign of CHF. Cut back on salt. Elevate legs.   5. HLD - Managed by PCP. Not on any regimen. She will benefits from low dose statin. Defer to PCP.   6. Surgical clearance - She is going out of state May 10th for bunionectomy with her podiatrist in [place] on May 17. She is cleared for surgery with low risk.   Discussed with Dr. Acie Fredrickson.   Medication Adjustments/Labs and Tests Ordered: Current medicines are reviewed at length with the patient today.  Concerns regarding medicines are outlined above.  Medication changes, Labs and Tests ordered today are listed in the Patient Instructions below. Patient Instructions  Medication Instructions:  Your physician recommends that you continue on your current medications as directed. Please refer to the Current Medication list given to you today.   Labwork: None ordered  Testing/Procedures: None ordered    Follow-Up: As needed with Dr. Acie Fredrickson   Any Other Special Instructions Will Be Listed Below (If Applicable).  Cleared for surgery   If you need a refill on your cardiac medications before your next appointment, please call your pharmacy.      Jarrett Soho, [place]    12/28/2016 11:59 AM    [facility] [address], [place], [place]  [address] Phone: [phone]; Fax: [phone]   Attending Note:   The patient was seen and examined.  Agree with assessment and plan as noted above.  Changes made to the above note as needed.  Patient seen and independently examined with Robbie Lis, PA .   We discussed all aspects of the encounter. I agree with the assessment and plan as stated above.  1.  Pre-Op evaluation :   She is at low risk for her upcoming bunion surgery .   2.  Systolic murmur:   Sound like a flow murmur .   She does not need any further evaluation at this time    I have spent a total of 40 minutes with patient reviewing hospital  notes , telemetry, EKGs, labs and examining patient as well as establishing an assessment and plan that was discussed with the patient. > 50% of time was spent in direct patient care.    Thayer Headings, Brooke Bonito., MD, [facility] 01/02/2017, 5:25 PM [address],  [place] Pager [phone]

## 2017-01-04 ENCOUNTER — Other Ambulatory Visit (INDEPENDENT_AMBULATORY_CARE_PROVIDER_SITE_OTHER): Payer: No Typology Code available for payment source

## 2017-01-04 DIAGNOSIS — E782 Mixed hyperlipidemia: Secondary | ICD-10-CM | POA: Diagnosis not present

## 2017-01-04 DIAGNOSIS — Z0000 Encounter for general adult medical examination without abnormal findings: Secondary | ICD-10-CM

## 2017-01-04 DIAGNOSIS — I10 Essential (primary) hypertension: Secondary | ICD-10-CM | POA: Diagnosis not present

## 2017-01-04 LAB — LIPID PANEL
CHOLESTEROL: 225 mg/dL — AB (ref 0–200)
HDL: 59.2 mg/dL (ref >39.00)
NonHDL: 165.3
TRIGLYCERIDES: 264 mg/dL — AB (ref 0.0–149.0)
Total CHOL/HDL Ratio: 4
VLDL: 52.8 mg/dL — AB (ref 0.0–40.0)

## 2017-01-04 LAB — LDL CHOLESTEROL, DIRECT: Direct LDL: 136 mg/dL

## 2017-01-04 LAB — CBC
HCT: 41.5 % (ref 36.0–46.0)
HEMOGLOBIN: 14.4 g/dL (ref 12.0–15.0)
MCHC: 34.8 g/dL (ref 30.0–36.0)
MCV: 86.5 fl (ref 78.0–100.0)
Platelets: 324 10*3/uL (ref 150.0–400.0)
RBC: 4.79 Mil/uL (ref 3.87–5.11)
RDW: 13.1 % (ref 11.5–15.5)
WBC: 6.8 10*3/uL (ref 4.0–10.5)

## 2017-01-04 LAB — COMPREHENSIVE METABOLIC PANEL
ALK PHOS: 56 U/L (ref 39–117)
ALT: 19 U/L (ref 0–35)
AST: 16 U/L (ref 0–37)
Albumin: 4.7 g/dL (ref 3.5–5.2)
BUN: 12 mg/dL (ref 6–23)
CHLORIDE: 101 meq/L (ref 96–112)
CO2: 29 mEq/L (ref 19–32)
Calcium: 10.1 mg/dL (ref 8.4–10.5)
Creatinine, Ser: 0.6 mg/dL (ref 0.40–1.20)
GFR: 107.92 mL/min (ref >60.00)
GLUCOSE: 98 mg/dL (ref 70–99)
POTASSIUM: 3.9 meq/L (ref 3.5–5.1)
Sodium: 136 mEq/L (ref 135–145)
TOTAL PROTEIN: 7.5 g/dL (ref 6.0–8.3)
Total Bilirubin: 0.6 mg/dL (ref 0.2–1.2)

## 2017-01-04 LAB — TSH: TSH: 0.62 u[IU]/mL (ref 0.35–4.50)

## 2017-01-08 ENCOUNTER — Encounter: Payer: Self-pay | Admitting: Family Medicine

## 2017-01-08 ENCOUNTER — Encounter: Payer: Self-pay | Admitting: Sports Medicine

## 2017-01-08 ENCOUNTER — Ambulatory Visit (INDEPENDENT_AMBULATORY_CARE_PROVIDER_SITE_OTHER): Payer: No Typology Code available for payment source | Admitting: Sports Medicine

## 2017-01-08 ENCOUNTER — Ambulatory Visit: Payer: Self-pay

## 2017-01-08 ENCOUNTER — Ambulatory Visit (INDEPENDENT_AMBULATORY_CARE_PROVIDER_SITE_OTHER)
Admission: RE | Admit: 2017-01-08 | Discharge: 2017-01-08 | Disposition: A | Discharge status: Home / Self Care | Payer: No Typology Code available for payment source | Source: Ambulatory Visit | Attending: Family Medicine | Admitting: Family Medicine

## 2017-01-08 VITALS — BP 120/70 | HR 78 | Ht 67.0 in | Wt 199.0 lb

## 2017-01-08 DIAGNOSIS — I10 Essential (primary) hypertension: Secondary | ICD-10-CM | POA: Diagnosis not present

## 2017-01-08 DIAGNOSIS — M7502 Adhesive capsulitis of left shoulder: Secondary | ICD-10-CM | POA: Insufficient documentation

## 2017-01-08 DIAGNOSIS — M25512 Pain in left shoulder: Principal | ICD-10-CM

## 2017-01-08 DIAGNOSIS — G8929 Other chronic pain: Secondary | ICD-10-CM

## 2017-01-08 DIAGNOSIS — M25552 Pain in left hip: Secondary | ICD-10-CM | POA: Insufficient documentation

## 2017-01-08 NOTE — Assessment & Plan Note (Signed)
See above

## 2017-01-08 NOTE — Progress Notes (Addendum)
OFFICE VISIT NOTE Megan Shah. Megan Shah, [place] at [facility] [phone]  Megan Shah - 61 y.o. female MRN [id number]  Date of birth: 09-02-1956  Visit Date: 01/08/2017  PCP: Mosie Lukes, MD   Referred by: Mosie Lukes, MD  Megan Shah, cma  acting as scribe for Dr. [name].  SUBJECTIVE:   Chief Complaint  Patient presents with  . Shoulder Pain    Left   HPI: As below and per problem based documentation when appropriate.  Megan Shah reports a 2-3 month HX of LT anterior shoulder pain with occasionally pain in bicep area. No injury. Alternating Cyclobenzaprine and Ibuprofen with some relief. No medication use today. Sharp Pain is triggered when reaching behind her back or grasping objects.  Minimal dull pain at rest.  She reports multiple other complaints today including right hip, right knee, right bunion.  Denies fevers, chills, recent weight gain or weight loss.  No night sweats. No significant nighttime awakenings due to this issue.     ROS  Otherwise per HPI.  HISTORY & PERTINENT PRIOR DATA:   She reports that she has quit smoking. She has never used smokeless tobacco. No results for input(s): HGBA1C, LABURIC in the last 8760 hours. Medications & Allergies reviewed per EMR Patient Active Problem List   Diagnosis Date Noted  . Adhesive capsulitis of left shoulder 01/08/2017  . Pain of left hip joint 01/08/2017  . Left shoulder pain 12/25/2016  . Preventative health care 12/25/2016  . Bunion of great toe of right foot 02/07/2016  . Atrophic vaginitis 02/07/2016  . Right hip pain 06/13/2015  . Post menopausal syndrome 06/13/2015  . Patient is Jehovah's Witness 06/07/2015  . Transfusion of blood product refused for religious reason 06/07/2015  . Overweight 06/07/2015  . History of chicken pox 06/07/2015  . Vitamin D deficiency 06/07/2015  . Palpitations 06/07/2015  . Atypical chest pain 06/07/2015  .  Hypertension   . Hyperlipidemia   . Thyroid disease    Past Medical History:  Diagnosis Date  . Atrophic vaginitis 02/07/2016  . Bunion of great toe of right foot 02/07/2016  . Diverticulitis   . History of chicken pox 06/07/2015  . Hyperlipidemia   . Hypertension   . Left shoulder pain 12/25/2016  . Overweight 06/07/2015  . Palpitations 06/07/2015  . Patient is Jehovah's Witness 06/07/2015  . Post menopausal syndrome 06/13/2015  . Preventative health care 12/25/2016  . Right hip pain 06/13/2015  . Thyroid disease   . Transfusion of blood product refused for religious reason 06/07/2015  . Vitamin D deficiency 06/07/2015   Family History  Problem Relation Age of Onset  . Arthritis Mother   . Hypertension Mother   . Hyperlipidemia Mother   . Heart disease Mother        CAD with stents, afib  . Atrial fibrillation Mother        flutter  . Arthritis Father        broken hip, sepsis   . Cancer Father        colon cancer, mirkel cell carcinoma  . Bunion Father   . Dementia Father   . Atrial fibrillation Father   . Arthritis Maternal Grandmother   . Arthritis Maternal Grandfather   . Cancer Maternal Grandfather 56       colon cancer  . Arthritis Paternal Grandmother   . Arthritis Paternal Grandfather   . Cancer Paternal Grandfather  colon cancer  . Colon polyps Brother        non cancer polyp  . Cancer Brother        lymphoma in remission  . Atrial fibrillation Brother   . Alcohol abuse Son    Past Surgical History:  Procedure Laterality Date  . WISDOM TOOTH EXTRACTION  Age 17   Social History   Occupational History  . volunteer    Social History Main Topics  . Smoking status: Former Research scientist (life sciences)  . Smokeless tobacco: Never Used     Comment: Smoked for 2 years as a teenager.  . Alcohol use 0.0 oz/week  . Drug use: No  . Sexual activity: Yes     Comment: lives with husband, moved from [place], no dietary restrictions, minimizes wheat and dairy, volunteer work     OBJECTIVE:  VS:  HT:5\' 7"  (170.2 cm)   WT:199 lb (90.3 kg)  BMI:31.2    BP:120/70  HR:78bpm  TEMP: ( )  RESP:97 % EXAM: WDWN, NAD, Non-toxic appearing Alert & appropriately interactive Not depressed or anxious appearing No increased work of breathing. Pupils are equal. EOM intact without nystagmus No clubbing or cyanosis of the extremities appreciated No significant rashes/lesions/ulcerations overlying the examined area. Radial pulses 2+/4.  No significant generalized UE edema.  Sensation intact to light touch in upper and lower extremities. Left shoulder: Overall well aligned.  Marked guarding.  She has pain with internal rotation which is limited to her PSIS on the left.  Pain with empty can testing, speeds testing and O'Brien's testing.  Pain with Hawkins.  External rotation restricted to 10 at 30 of abduction.  Internal rotation to 40 which is markedly different than her right.  No additional findings.    PROCEDURE NOTE: ULTRASOUND GUIDED left shoulder intra-articularINJECTION  Images were obtained and interpreted by myself, Teresa Coombs, DO  Images have been saved and stored to PACS system. Images obtained on: GE S7 Ultrasound machine  ULTRASOUND FINDINGS:  Biceps Tendon: Intact with positive halo sign and hypoechoic changes Pec Major Insertion: Normal Subscapularis Tendon: Chronic thickening with interstitial tearing without significant retraction Supraspinatus Tendon: Interstitial tear without significant retraction within the anterior fibers.  Chronic thickening appreciated Infraspinatus/Teres Minor Tendon: Normal AC Joint: Moderate degenerative change JOINT: No significant GH spurring appreciated LABRUM: Not evaluated   DESCRIPTION OF PROCEDURE:  The patient's clinical condition is marked by substantial pain and/or significant functional disability. Other conservative therapy has not provided relief, is contraindicated, or not appropriate. There is a  reasonable likelihood that injection will significantly improve the patient's pain and/or functional impairment. After discussing the risks, benefits and expected outcomes of the injection and all questions were reviewed and answered, the patient wished to undergo the above named procedure. Verbal consent was obtained. The ultrasound was used to identify the target structure and adjacent neurovascular structures. The skin was then prepped in sterile fashion and the target structure was injected under direct visualization using sterile technique as below: PREP: Alcohol, Ethel Chloride APPROACH: Posterior, stopcock technique, 21g 2" needle INJECTATE: 5cc 1% lidocaine, 3cc 0.5% marcaine, 2cc 40mg  DepoMedrol ASPIRATE: N/A DRESSING: Band-Aid  Post procedural instructions including recommending icing and warning signs for infection were reviewed. This procedure was well tolerated and there were no complications.   IMPRESSION: Succesful US Guided Injection  ASSESSMENT & PLAN:  Visit Diagnoses:  1. Chronic left shoulder pain   2. 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  Therapeutic exercises reviewed with the patient and athletic training staff.  Focus on eccentric phase of supraspinatus strengthening.  Avoid any type of overhead activity or lifting heavy weight. 6 week follow-up and consider repeat intra-articular injection at that time.  Patient is getting ready to go on a prolonged trip out of town for bunionectomy surgery and if she is interested in physical therapy when she returns she will call and let us know this.  I did emphasize the importance of 6 days per week of home exercises given the circumstances  +++++++++++++++++++++++++++++++++++++++++++++++++++++++++++++++ PROCEDURE NOTE:  THERAPEUTIC EXERCISES (97110) 15 minutes spent for Therapeutic exercises as stated in above notes.  This included exercises focusing on stretching, strengthening, with significant focus on eccentric aspects.   Proper technique shown and discussed handout in great detail with ATC.  All questions were discussed and answered.        Relevant Orders   [place] LIMITED JOINT SPACE STRUCTURES UP LEFT(NO LINKED CHARGES)   Adhesive capsulitis of left shoulder    See above        Meds: No orders of the defined types were placed in this encounter.   Orders:  Orders Placed This Encounter  Procedures  . [place] LIMITED JOINT SPACE STRUCTURES UP LEFT(NO LINKED CHARGES)    Follow-up: Return in about 6 weeks (around 02/19/2017).   CMA/ATC served as Education administrator during this visit. History, Physical, and Plan performed by medical provider. Documentation and orders reviewed and attested to.      Teresa Coombs, [place] [facility] Physician    01/11/2017 8:26 AM

## 2017-01-08 NOTE — Patient Instructions (Addendum)
Please perform the exercise program that Jeneen Rinks has prepared for you and gone over in detail on a daily basis.  In addition to the handout you were provided you can access your program through: [email or web address]   Your unique program code is: [email or web address]   Adhesive Capsulitis Adhesive capsulitis is inflammation of the tendons and ligaments that surround the shoulder joint (shoulder capsule). This condition causes the shoulder to become stiff and painful to move. Adhesive capsulitis is also called frozen shoulder. What are the causes? This condition may be caused by:  An injury to the shoulder joint.  Straining the shoulder.  Not moving the shoulder for a period of time. This can happen if your arm was injured or in a sling.  Long-standing health problems, such as:  Diabetes.  Thyroid problems.  Heart disease.  Stroke.  Rheumatoid arthritis.  Lung disease. In some cases, the cause may not be known. What increases the risk? This condition is more likely to develop in:  Women.  People who are older than 61 years of age. What are the signs or symptoms? Symptoms of this condition include:  Pain in the shoulder when moving the arm. There may also be pain when parts of the shoulder are touched. The pain is worse at night or when at rest.  Soreness or aching in the shoulder.  Inability to move the shoulder normally.  Muscle spasms. How is this diagnosed? This condition is diagnosed with a physical exam and imaging tests, such as an X-ray or MRI. How is this treated? This condition may be treated with:  Treatment of the underlying cause or condition.  Physical therapy. This involves performing exercises to get the shoulder moving again.  Medicine. Medicine may be given to relieve pain, inflammation, or muscle spasms.  Steroid injections into the shoulder joint.  Shoulder manipulation. This is a procedure to move the shoulder into another position. It is done after  you are given a medicine to make you fall asleep (general anesthetic). The joint may also be injected with salt water at high pressure to break down scarring.  Surgery. This may be done in severe cases when other treatments have failed. Although most people recover completely from adhesive capsulitis, some may not regain the full movement of the shoulder. Follow these instructions at home:  Take over-the-counter and prescription medicines only as told by your health care provider.  If you are being treated with physical therapy, follow instructions from your physical therapist.  Avoid exercises that put a lot of demand on your shoulder, such as throwing. These exercises can make pain worse.  If directed, apply ice to the injured area:  Put ice in a plastic bag.  Place a towel between your skin and the bag.  Leave the ice on for 20 minutes, 2-3 times per day. Contact a health care provider if:  You develop new symptoms.  Your symptoms get worse. This information is not intended to replace advice given to you by your health care provider. Make sure you discuss any questions you have with your health care provider. Document Released: 06/18/2009 Document Revised: 01/27/2016 Document Reviewed: 12/14/2014 Elsevier Interactive Patient Education  2017 [employer].

## 2017-01-08 NOTE — Assessment & Plan Note (Addendum)
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  Therapeutic exercises reviewed with the patient and athletic training staff.  Focus on eccentric phase of supraspinatus strengthening.  Avoid any type of overhead activity or lifting heavy weight. 6 week follow-up and consider repeat intra-articular injection at that time.  Patient is getting ready to go on a prolonged trip out of town for bunionectomy surgery and if she is interested in physical therapy when she returns she will call and let us know this.  I did emphasize the importance of 6 days per week of home exercises given the circumstances  +++++++++++++++++++++++++++++++++++++++++++++++++++++++++++++++ PROCEDURE NOTE: THERAPEUTIC EXERCISES (97110) 15 minutes spent for Therapeutic exercises as stated in above notes.  This included exercises focusing on stretching, strengthening, with significant focus on eccentric aspects.   Proper technique shown and discussed handout in great detail with ATC.  All questions were discussed and answered.

## 2017-01-09 ENCOUNTER — Encounter: Payer: Self-pay | Admitting: Family Medicine

## 2017-02-01 ENCOUNTER — Ambulatory Visit
Admission: RE | Admit: 2017-02-01 | Discharge: 2017-02-01 | Disposition: A | Discharge status: Home / Self Care | Payer: No Typology Code available for payment source | Source: Ambulatory Visit | Attending: *Deleted | Admitting: *Deleted

## 2017-02-01 ENCOUNTER — Other Ambulatory Visit: Payer: Self-pay | Admitting: *Deleted

## 2017-02-01 DIAGNOSIS — Z09 Encounter for follow-up examination after completed treatment for conditions other than malignant neoplasm: Secondary | ICD-10-CM

## 2017-02-15 ENCOUNTER — Ambulatory Visit
Admission: RE | Admit: 2017-02-15 | Discharge: 2017-02-15 | Disposition: A | Discharge status: Home / Self Care | Payer: No Typology Code available for payment source | Source: Ambulatory Visit | Attending: *Deleted | Admitting: *Deleted

## 2017-02-15 ENCOUNTER — Other Ambulatory Visit: Payer: Self-pay | Admitting: *Deleted

## 2017-02-15 ENCOUNTER — Ambulatory Visit
Admission: RE | Admit: 2017-02-15 | Discharge: 2017-02-15 | Disposition: A | Discharge status: Home / Self Care | Payer: Self-pay | Source: Ambulatory Visit | Attending: Family Medicine | Admitting: Family Medicine

## 2017-02-15 DIAGNOSIS — Z9889 Other specified postprocedural states: Secondary | ICD-10-CM

## 2017-02-15 DIAGNOSIS — Z1231 Encounter for screening mammogram for malignant neoplasm of breast: Secondary | ICD-10-CM

## 2017-02-19 ENCOUNTER — Ambulatory Visit: Payer: No Typology Code available for payment source | Admitting: Sports Medicine

## 2017-03-01 ENCOUNTER — Ambulatory Visit
Admission: RE | Admit: 2017-03-01 | Discharge: 2017-03-01 | Disposition: A | Discharge status: Home / Self Care | Payer: No Typology Code available for payment source | Source: Ambulatory Visit | Attending: Foot & Ankle Surgery | Admitting: Foot & Ankle Surgery

## 2017-03-01 ENCOUNTER — Other Ambulatory Visit: Payer: Self-pay | Admitting: Foot & Ankle Surgery

## 2017-03-01 DIAGNOSIS — Z09 Encounter for follow-up examination after completed treatment for conditions other than malignant neoplasm: Secondary | ICD-10-CM

## 2017-03-06 ENCOUNTER — Other Ambulatory Visit: Payer: Self-pay | Admitting: Family Medicine

## 2017-03-06 ENCOUNTER — Telehealth: Payer: Self-pay | Admitting: Physician Assistant

## 2017-03-06 ENCOUNTER — Encounter: Payer: Self-pay | Admitting: Family Medicine

## 2017-03-06 DIAGNOSIS — R079 Chest pain, unspecified: Secondary | ICD-10-CM

## 2017-03-06 DIAGNOSIS — R002 Palpitations: Secondary | ICD-10-CM

## 2017-03-06 MED ORDER — PROPRANOLOL HCL 10 MG PO TABS: 10.0000 mg | ORAL_TABLET | Freq: Four times a day (QID) | ORAL | 11 refills | Status: DC | PRN

## 2017-03-06 MED ORDER — BISOPROLOL-HYDROCHLOROTHIAZIDE 5-6.25 MG PO TABS: 1.0000 | ORAL_TABLET | Freq: Every day | ORAL | 1 refills | Status: DC

## 2017-03-06 NOTE — Telephone Encounter (Signed)
Spoke with pt, for the last couple days she has had a sharpe, tightness in her chest and shoulder blade area and back. It only last a couple seconds and occurs while at rest. She also reports feeling of heart beating fast and 125-150 bpm on her watch. The palpitations will last 30 secs at a time. The tightness has been occurring once weekly and she is concerned. She is also worried about atrial fib, both her parents have the arrhythmia. Will forward to dr Acie Fredrickson to see if monitor is appropriate prior to any follow up appointment.

## 2017-03-06 NOTE — Telephone Encounter (Signed)
Spoke with patient and reviewed Dr. Elmarie Shiley advice with her. She is in agreement to be scheduled for a 30 day event monitor. I scheduled her for that appointment on Monday July 16 which is next available. I explained the use of propranolol 10 mg QID prn to her and she verbalized understanding. I explained that this can be used in addition to her bisoprolol/HCTZ. She asked about her symptoms of chest discomfort. She states these occur intermittently, usually at rest. She states they are not associated with her palpitations, which was my interpretation from her previous message. She states one recent episode occurred while she was sitting in a massage chair and was pressed in her back by one of the massage parts. She states another episode occurred in the front of the chest and felt more like a squeezing sensation. She states the discomfort has also occurred at times of exertion. She is concerned about her family history of cardiac problems. I advised that I will review this information with Dr. Acie Fredrickson and call her back with his advice. She verbalized understanding and agreement.

## 2017-03-06 NOTE — Telephone Encounter (Signed)
Lets place a 30 day  Monitor  Can also try propranolol 10 mg QID PRN

## 2017-03-06 NOTE — Telephone Encounter (Signed)
I spoke with Dr. Acie Fredrickson who advised we order a GXT to evaluate the patient's chest pain. He advised I discuss with her her ability to walk on the treadmill since she had bunion surgery in May.  I spoke with patient who states she has just come out of her post-surgical boot. She requests GXT to be done the week of July 23 and states once it is scheduled, she will call back to let us know if she is unable to walk. She states she will be out of town next week visiting her mother. I advised her to call back with questions or concerns prior to appointments. She thanked me for the call.

## 2017-03-06 NOTE — Telephone Encounter (Signed)
New message    Pt is having increased back pains and requests a call back.

## 2017-04-02 ENCOUNTER — Telehealth: Payer: Self-pay | Admitting: Cardiovascular Disease

## 2017-04-02 NOTE — Telephone Encounter (Signed)
Call patient to reschedule the GXT she had cancel.  Per patient she don't want to do now since she is moving to [place].  I will cancel the order.

## 2017-04-17 ENCOUNTER — Encounter: Payer: Self-pay | Admitting: Family Medicine

## 2017-04-17 ENCOUNTER — Ambulatory Visit (INDEPENDENT_AMBULATORY_CARE_PROVIDER_SITE_OTHER): Payer: PRIVATE HEALTH INSURANCE | Admitting: Family Medicine

## 2017-04-17 DIAGNOSIS — E782 Mixed hyperlipidemia: Secondary | ICD-10-CM | POA: Diagnosis not present

## 2017-04-17 DIAGNOSIS — I10 Essential (primary) hypertension: Secondary | ICD-10-CM

## 2017-04-17 DIAGNOSIS — N952 Postmenopausal atrophic vaginitis: Secondary | ICD-10-CM

## 2017-04-17 MED ORDER — BISOPROLOL-HYDROCHLOROTHIAZIDE 5-6.25 MG PO TABS: 1.0000 | ORAL_TABLET | Freq: Every day | ORAL | 1 refills | Status: DC

## 2017-04-17 MED ORDER — THYROID 60 MG PO TABS
ORAL_TABLET | ORAL | Status: DC
Start: 2017-04-17 — End: 2021-05-24

## 2017-04-17 NOTE — Assessment & Plan Note (Signed)
Well controlled, no changes to meds. Encouraged heart healthy diet such as the DASH diet and exercise as tolerated.  °

## 2017-04-17 NOTE — Progress Notes (Signed)
Subjective:  .pac  Patient ID: Megan Shah, female    DOB: Jan 24, 1956, 61 y.o.   MRN: [id number]  No chief complaint on file.   HPI  Patient is in today for a follow up and she is preparing to move back to [place]. She is in need of some medication refills. She feels well. No polyuria or polydipsia. No recent febrile illness or acute hospitlaizations. Denies CP/palp/SOB/HA/congestion/fevers/GI or GU c/o. Taking meds as prescribed  Patient Care Team: Mosie Lukes, MD as PCP - General (Family Medicine) Kerney Elbe, MD as Referring Physician (Family Medicine)   Past Medical History:  Diagnosis Date  . Atrophic vaginitis 02/07/2016  . Bunion of great toe of right foot 02/07/2016  . Diverticulitis   . History of chicken pox 06/07/2015  . Hyperlipidemia   . Hypertension   . Left shoulder pain 12/25/2016  . Overweight 06/07/2015  . Palpitations 06/07/2015  . Patient is Jehovah's Witness 06/07/2015  . Post menopausal syndrome 06/13/2015  . Preventative health care 12/25/2016  . Right hip pain 06/13/2015  . Thyroid disease   . Transfusion of blood product refused for religious reason 06/07/2015  . Vitamin D deficiency 06/07/2015    Past Surgical History:  Procedure Laterality Date  . WISDOM TOOTH EXTRACTION  Age 38    Family History  Problem Relation Age of Onset  . Arthritis Mother   . Hypertension Mother   . Hyperlipidemia Mother   . Heart disease Mother        CAD with stents, afib  . Atrial fibrillation Mother        flutter  . Arthritis Father        broken hip, sepsis   . Cancer Father        colon cancer, mirkel cell carcinoma  . Bunion Father   . Dementia Father   . Atrial fibrillation Father   . Arthritis Maternal Grandmother   . Arthritis Maternal Grandfather   . Cancer Maternal Grandfather 42       colon cancer  . Arthritis Paternal Grandmother   . Arthritis Paternal Grandfather   . Cancer Paternal Grandfather        colon cancer  . Colon polyps  Brother        non cancer polyp  . Cancer Brother        lymphoma in remission  . Atrial fibrillation Brother   . Alcohol abuse Son     Social History   Social History  . Marital status: Married    Spouse name: N/A  . Number of children: N/A  . Years of education: N/A   Occupational History  . volunteer    Social History Main Topics  . Smoking status: Former Research scientist (life sciences)  . Smokeless tobacco: Never Used     Comment: Smoked for 2 years as a teenager.  . Alcohol use 0.0 oz/week  . Drug use: No  . Sexual activity: Yes     Comment: lives with husband, moved from [place], no dietary restrictions, minimizes wheat and dairy, volunteer work   Other Topics Concern  . Not on file   Social History Narrative  . No narrative on file    Outpatient Medications Prior to Visit  Medication Sig Dispense Refill  . Acetylcysteine (N-ACETYL-L-CYSTEINE PO) Take by mouth daily.    Marland Kitchen aspirin 81 MG chewable tablet Chew 81 mg by mouth daily.    . betamethasone dipropionate 0.05 % lotion Apply topically 2 (two) times daily. 60 mL  0  . Cholecalciferol (D 2000) 2000 units TABS Take 4,000 Units by mouth 2 (two) times daily.     Marland Kitchen conjugated estrogens (PREMARIN) vaginal cream Place 1 Applicatorful vaginally every other day. 42.5 g 1  . cyclobenzaprine (FLEXERIL) 10 MG tablet Take 0.5-1 tablets (5-10 mg total) by mouth 2 (two) times daily as needed for muscle spasms. 30 tablet 1  . DHEA 10 MG CAPS Take by mouth daily.    . Magnesium Citrate POWD Take 615 mg by mouth 2 (two) times daily.    . Multiple Vitamins-Minerals (MULTIVITAMIN ADULT PO) Take 1 tablet by mouth daily.    . NON FORMULARY Apply topically.    . Omega-3 Fatty Acids (FISH OIL PO) Take by mouth.    . TURMERIC PO Take by mouth.    . bisoprolol-hydrochlorothiazide (ZIAC) 5-6.25 MG tablet Take 1 tablet by mouth daily. 90 tablet 1  . propranolol (INDERAL) 10 MG tablet Take 1 tablet (10 mg total) by mouth 4 (four) times daily as needed. 60  tablet 11  . thyroid (ARMOUR) 60 MG tablet Take 1 tablet by mouth 5 days a week and 1/2 tab po on the other 2 days     Facility-Administered Medications Prior to Visit  Medication Dose Route Frequency Provider Last Rate Last Dose  . bisoprolol-hydrochlorothiazide (ZIAC) 2.5-6.25 MG per tablet 1 tablet  1 tablet Oral Daily Mosie Lukes, MD        No Known Allergies  Review of Systems  Constitutional: Negative for fever and malaise/fatigue.  HENT: Negative for congestion.   Eyes: Negative for blurred vision.  Respiratory: Negative for cough and shortness of breath.   Cardiovascular: Negative for chest pain, palpitations and leg swelling.  Gastrointestinal: Negative for vomiting.  Musculoskeletal: Negative for back pain.  Skin: Negative for rash.  Neurological: Negative for loss of consciousness and headaches.       Objective:    Physical Exam  Constitutional: She is oriented to person, place, and time. She appears well-developed and well-nourished. No distress.  HENT:  Head: Normocephalic and atraumatic.  Eyes: Conjunctivae are normal.  Neck: Normal range of motion. No thyromegaly present.  Cardiovascular: Normal rate and regular rhythm.   Pulmonary/Chest: Effort normal and breath sounds normal. She has no wheezes.  Abdominal: Soft. Bowel sounds are normal. There is no tenderness.  Musculoskeletal: Normal range of motion. She exhibits no edema or deformity.  Neurological: She is alert and oriented to person, place, and time.  Skin: Skin is warm and dry. She is not diaphoretic.  Psychiatric: She has a normal mood and affect.    BP 118/62 (BP Location: Left Arm, Patient Position: Sitting, Cuff Size: Normal)   Pulse 76   Temp 98.6 F (37 C) (Oral)   Resp 18   SpO2 97%  Wt Readings from Last 3 Encounters:  01/08/17 199 lb (90.3 kg)  12/28/16 204 lb (92.5 kg)  12/25/16 204 lb 6.4 oz (92.7 kg)   BP Readings from Last 3 Encounters:  04/17/17 118/62  01/08/17 120/70    12/28/16 136/88      There is no immunization history on file for this patient.  Health Maintenance  Topic Date Due  . HIV Screening  09/19/1970  . INFLUENZA VACCINE  04/04/2017  . TETANUS/TDAP  11/02/2020 (Originally 09/19/1974)  . PAP SMEAR  12/20/2017  . MAMMOGRAM  02/16/2019  . COLONOSCOPY  09/05/2023  . Hepatitis C Screening  Completed    Lab Results  Component Value Date  WBC 6.8 01/04/2017   HGB 14.4 01/04/2017   HCT 41.5 01/04/2017   PLT 324.0 01/04/2017   GLUCOSE 98 01/04/2017   CHOL 225 (H) 01/04/2017   TRIG 264.0 (H) 01/04/2017   HDL 59.20 01/04/2017   LDLDIRECT 136.0 01/04/2017   ALT 19 01/04/2017   AST 16 01/04/2017   NA 136 01/04/2017   K 3.9 01/04/2017   CL 101 01/04/2017   CREATININE 0.60 01/04/2017   BUN 12 01/04/2017   CO2 29 01/04/2017   TSH 0.62 01/04/2017    Lab Results  Component Value Date   TSH 0.62 01/04/2017   Lab Results  Component Value Date   WBC 6.8 01/04/2017   HGB 14.4 01/04/2017   HCT 41.5 01/04/2017   MCV 86.5 01/04/2017   PLT 324.0 01/04/2017   Lab Results  Component Value Date   NA 136 01/04/2017   K 3.9 01/04/2017   CO2 29 01/04/2017   GLUCOSE 98 01/04/2017   BUN 12 01/04/2017   CREATININE 0.60 01/04/2017   BILITOT 0.6 01/04/2017   ALKPHOS 56 01/04/2017   AST 16 01/04/2017   ALT 19 01/04/2017   PROT 7.5 01/04/2017   ALBUMIN 4.7 01/04/2017   CALCIUM 10.1 01/04/2017   GFR 107.92 01/04/2017   Lab Results  Component Value Date   CHOL 225 (H) 01/04/2017   Lab Results  Component Value Date   HDL 59.20 01/04/2017   No results found for: [facility] Lab Results  Component Value Date   TRIG 264.0 (H) 01/04/2017   Lab Results  Component Value Date   CHOLHDL 4 01/04/2017   No results found for: HGBA1C       Assessment & Plan:   Problem List Items Addressed This Visit    Hypertension    Well controlled, no changes to meds. Encouraged heart healthy diet such as the DASH diet and exercise as tolerated.        Relevant Medications   bisoprolol-hydrochlorothiazide (ZIAC) 5-6.25 MG tablet   Hyperlipidemia    Encouraged heart healthy diet, increase exercise, avoid trans fats, consider a krill oil cap daily      Relevant Medications   bisoprolol-hydrochlorothiazide (ZIAC) 5-6.25 MG tablet   Atrophic vaginitis    Uses meds Compounded for patient by [facility] previously Progesterone 200mg /ml, apply 3 clicks (2.83TD) topically qhs, hold 1 week per month (30gm) Bi-Est 50/50, apply 5 mg/ml cream, 2 clicks topically (1/7OH) qd and off 1 week per month (5m)         I have discontinued Ms. Bamba's propranolol. I am also having her maintain her aspirin, Cholecalciferol, NON FORMULARY, Acetylcysteine (N-ACETYL-L-CYSTEINE PO), Omega-3 Fatty Acids (FISH OIL PO), TURMERIC PO, Multiple Vitamins-Minerals (MULTIVITAMIN ADULT PO), DHEA, Magnesium Citrate, conjugated estrogens, betamethasone dipropionate, cyclobenzaprine, bisoprolol-hydrochlorothiazide, and thyroid. We will continue to administer bisoprolol-hydrochlorothiazide.  Meds ordered this encounter  Medications  . bisoprolol-hydrochlorothiazide (ZIAC) 5-6.25 MG tablet    Sig: Take 1 tablet by mouth daily.    Dispense:  90 tablet    Refill:  1  . thyroid (ARMOUR) 60 MG tablet    Sig: Take 1 tablet by mouth 5 days a week and 1/2 tab po on the other 2 days    CMA served as scribe during this visit. History, Physical and Plan performed by medical provider. Documentation and orders reviewed and attested to.  Penni Homans, MD

## 2017-04-17 NOTE — Patient Instructions (Signed)
Chlorthalidone is a good diuretic blood pressure Hypertension Hypertension, commonly called high blood pressure, is when the force of blood pumping through the arteries is too strong. The arteries are the blood vessels that carry blood from the heart throughout the body. Hypertension forces the heart to work harder to pump blood and may cause arteries to become narrow or stiff. Having untreated or uncontrolled hypertension can cause heart attacks, strokes, kidney disease, and other problems. A blood pressure reading consists of a higher number over a lower number. Ideally, your blood pressure should be below 120/80. The first ("top") number is called the systolic pressure. It is a measure of the pressure in your arteries as your heart beats. The second ("bottom") number is called the diastolic pressure. It is a measure of the pressure in your arteries as the heart relaxes. What are the causes? The cause of this condition is not known. What increases the risk? Some risk factors for high blood pressure are under your control. Others are not. Factors you can change  Smoking.  Having type 2 diabetes mellitus, high cholesterol, or both.  Not getting enough exercise or physical activity.  Being overweight.  Having too much fat, sugar, calories, or salt (sodium) in your diet.  Drinking too much alcohol. Factors that are difficult or impossible to change  Having chronic kidney disease.  Having a family history of high blood pressure.  Age. Risk increases with age.  Race. You may be at higher risk if you are African-American.  Gender. Men are at higher risk than women before age 57. After age 54, women are at higher risk than men.  Having obstructive sleep apnea.  Stress. What are the signs or symptoms? Extremely high blood pressure (hypertensive crisis) may cause:  Headache.  Anxiety.  Shortness of breath.  Nosebleed.  Nausea and vomiting.  Severe chest pain.  Jerky  movements you cannot control (seizures).  How is this diagnosed? This condition is diagnosed by measuring your blood pressure while you are seated, with your arm resting on a surface. The cuff of the blood pressure monitor will be placed directly against the skin of your upper arm at the level of your heart. It should be measured at least twice using the same arm. Certain conditions can cause a difference in blood pressure between your right and left arms. Certain factors can cause blood pressure readings to be lower or higher than normal (elevated) for a short period of time:  When your blood pressure is higher when you are in a health care provider's office than when you are at home, this is called white coat hypertension. Most people with this condition do not need medicines.  When your blood pressure is higher at home than when you are in a health care provider's office, this is called masked hypertension. Most people with this condition may need medicines to control blood pressure.  If you have a high blood pressure reading during one visit or you have normal blood pressure with other risk factors:  You may be asked to return on a different day to have your blood pressure checked again.  You may be asked to monitor your blood pressure at home for 1 week or longer.  If you are diagnosed with hypertension, you may have other blood or imaging tests to help your health care provider understand your overall risk for other conditions. How is this treated? This condition is treated by making healthy lifestyle changes, such as eating healthy foods, exercising  more, and reducing your alcohol intake. Your health care provider may prescribe medicine if lifestyle changes are not enough to get your blood pressure under control, and if:  Your systolic blood pressure is above 130.  Your diastolic blood pressure is above 80.  Your personal target blood pressure may vary depending on your medical  conditions, your age, and other factors. Follow these instructions at home: Eating and drinking  Eat a diet that is high in fiber and potassium, and low in sodium, added sugar, and fat. An example eating plan is called the DASH (Dietary Approaches to Stop Hypertension) diet. To eat this way: ? Eat plenty of fresh fruits and vegetables. Try to fill half of your plate at each meal with fruits and vegetables. ? Eat whole grains, such as whole wheat pasta, brown rice, or whole grain bread. Fill about one quarter of your plate with whole grains. ? Eat or drink low-fat dairy products, such as skim milk or low-fat yogurt. ? Avoid fatty cuts of meat, processed or cured meats, and poultry with skin. Fill about one quarter of your plate with lean proteins, such as fish, chicken without skin, beans, eggs, and tofu. ? Avoid premade and processed foods. These tend to be higher in sodium, added sugar, and fat.  Reduce your daily sodium intake. Most people with hypertension should eat less than 1,500 mg of sodium a day.  Limit alcohol intake to no more than 1 drink a day for nonpregnant women and 2 drinks a day for men. One drink equals 12 oz of beer, 5 oz of wine, or 1 oz of hard liquor. Lifestyle  Work with your health care provider to maintain a healthy body weight or to lose weight. Ask what an ideal weight is for you.  Get at least 30 minutes of exercise that causes your heart to beat faster (aerobic exercise) most days of the week. Activities may include walking, swimming, or biking.  Include exercise to strengthen your muscles (resistance exercise), such as pilates or lifting weights, as part of your weekly exercise routine. Try to do these types of exercises for 30 minutes at least 3 days a week.  Do not use any products that contain nicotine or tobacco, such as cigarettes and e-cigarettes. If you need help quitting, ask your health care provider.  Monitor your blood pressure at home as told by  your health care provider.  Keep all follow-up visits as told by your health care provider. This is important. Medicines  Take over-the-counter and prescription medicines only as told by your health care provider. Follow directions carefully. Blood pressure medicines must be taken as prescribed.  Do not skip doses of blood pressure medicine. Doing this puts you at risk for problems and can make the medicine less effective.  Ask your health care provider about side effects or reactions to medicines that you should watch for. Contact a health care provider if:  You think you are having a reaction to a medicine you are taking.  You have headaches that keep coming back (recurring).  You feel dizzy.  You have swelling in your ankles.  You have trouble with your vision. Get help right away if:  You develop a severe headache or confusion.  You have unusual weakness or numbness.  You feel faint.  You have severe pain in your chest or abdomen.  You vomit repeatedly.  You have trouble breathing. Summary  Hypertension is when the force of blood pumping through your arteries is  too strong. If this condition is not controlled, it may put you at risk for serious complications.  Your personal target blood pressure may vary depending on your medical conditions, your age, and other factors. For most people, a normal blood pressure is less than 120/80.  Hypertension is treated with lifestyle changes, medicines, or a combination of both. Lifestyle changes include weight loss, eating a healthy, low-sodium diet, exercising more, and limiting alcohol. This information is not intended to replace advice given to you by your health care provider. Make sure you discuss any questions you have with your health care provider. Document Released: 08/21/2005 Document Revised: 07/19/2016 Document Reviewed: 07/19/2016 Elsevier Interactive Patient Education  [employer].

## 2017-04-22 NOTE — Assessment & Plan Note (Addendum)
Uses meds Compounded for patient by [facility] previously Progesterone 200mg /ml, apply 3 clicks (0.98JX) topically qhs, hold 1 week per month (30gm) Bi-Est 50/50, apply 5 mg/ml cream, 2 clicks topically (9/1YN) qd and off 1 week per month (51m)

## 2017-04-22 NOTE — Assessment & Plan Note (Signed)
Encouraged heart healthy diet, increase exercise, avoid trans fats, consider a krill oil cap daily 

## 2017-11-03 IMAGING — DX DG HIP (WITH OR WITHOUT PELVIS) 2V BILAT
5 series · 5 of 5 positions shown · non-contrast
Comparison: No prior.

CLINICAL DATA: Hip pain.  No known injury.

EXAM:
DG HIP (WITH OR WITHOUT PELVIS) 2V BILAT

[pelvis ap]
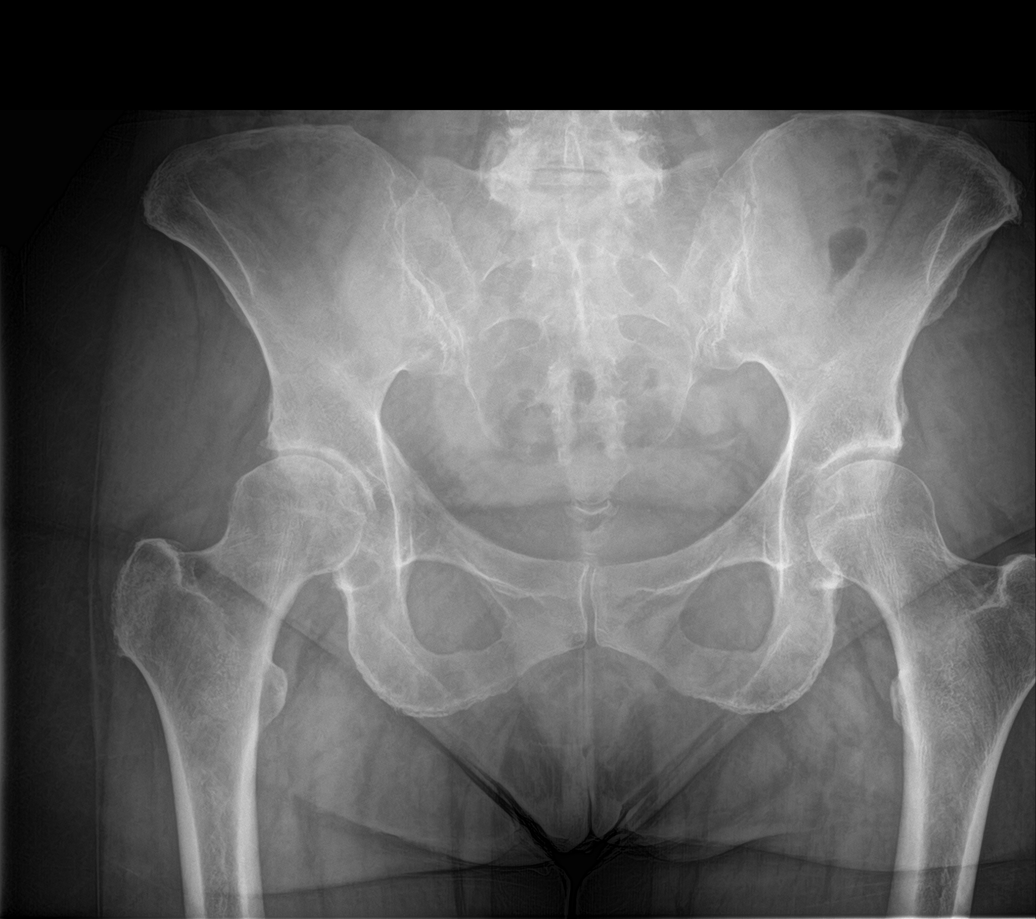

[hip ap (1 of 2)]
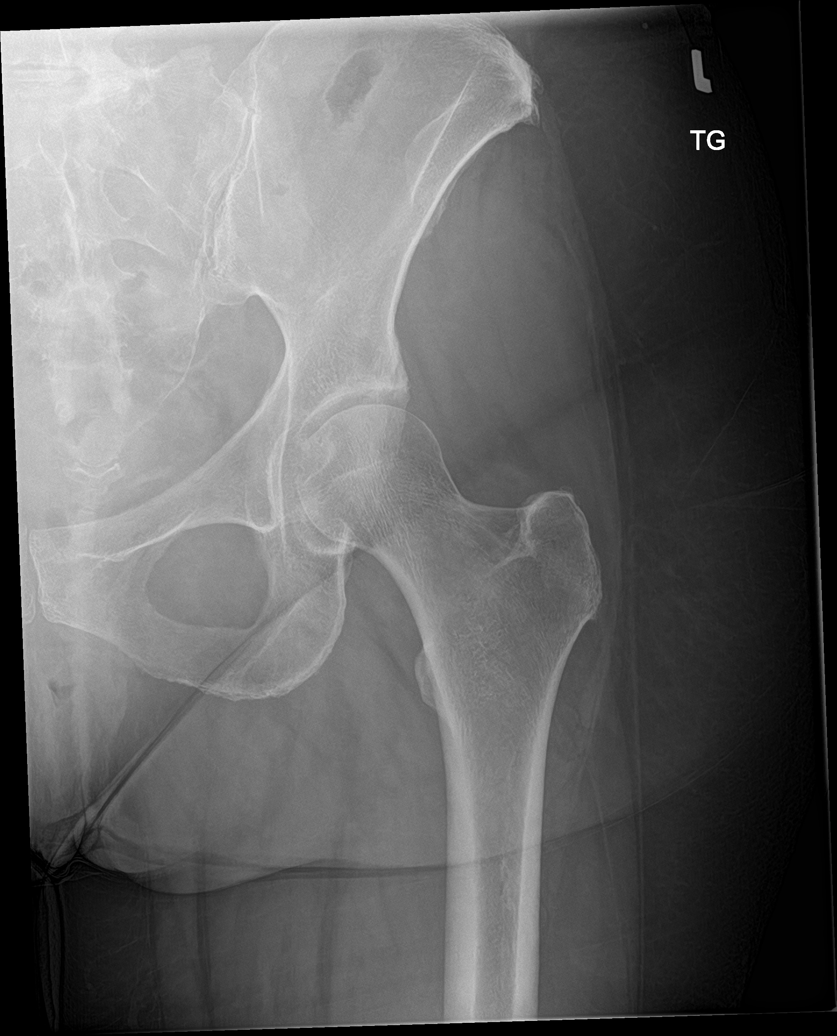

[hip lat (1 of 2)]
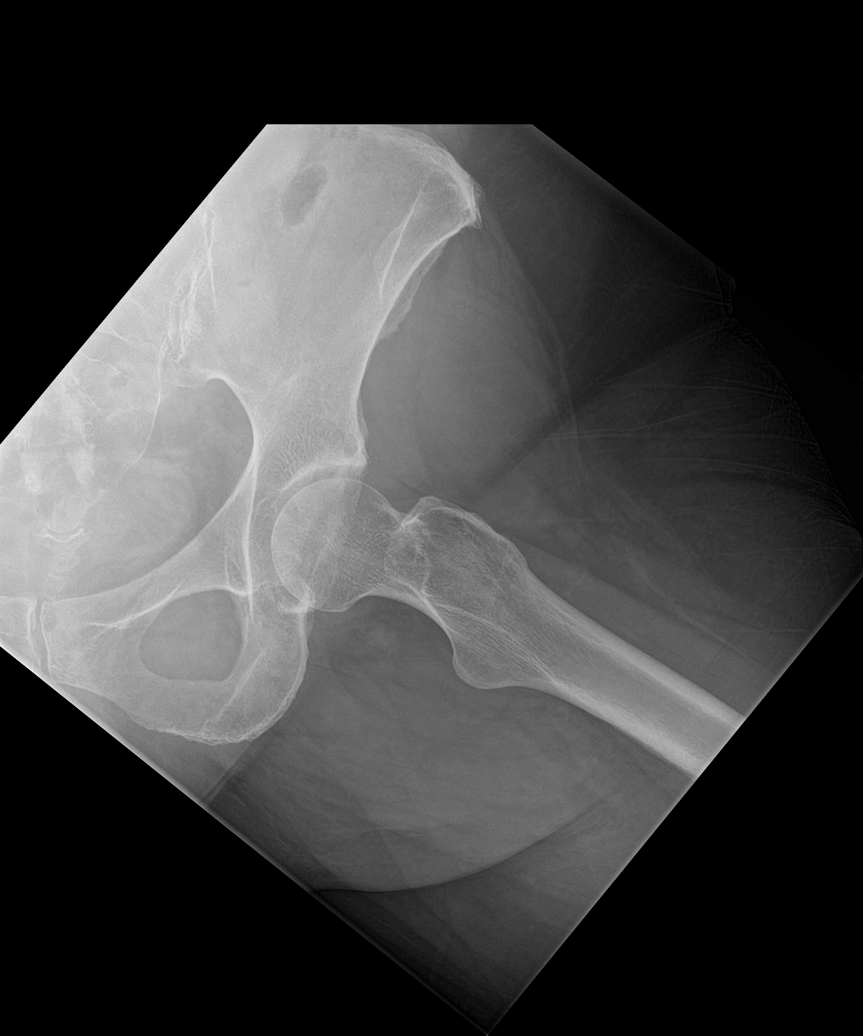

[hip ap (2 of 2)]
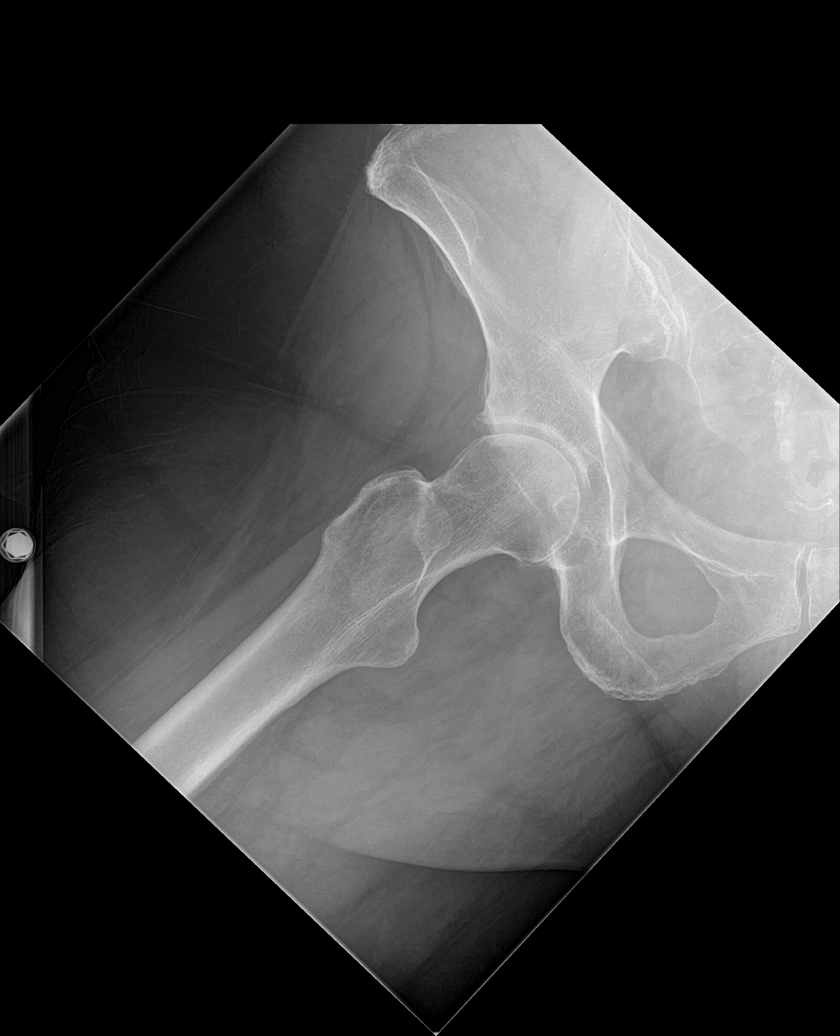

[hip lat (2 of 2)]
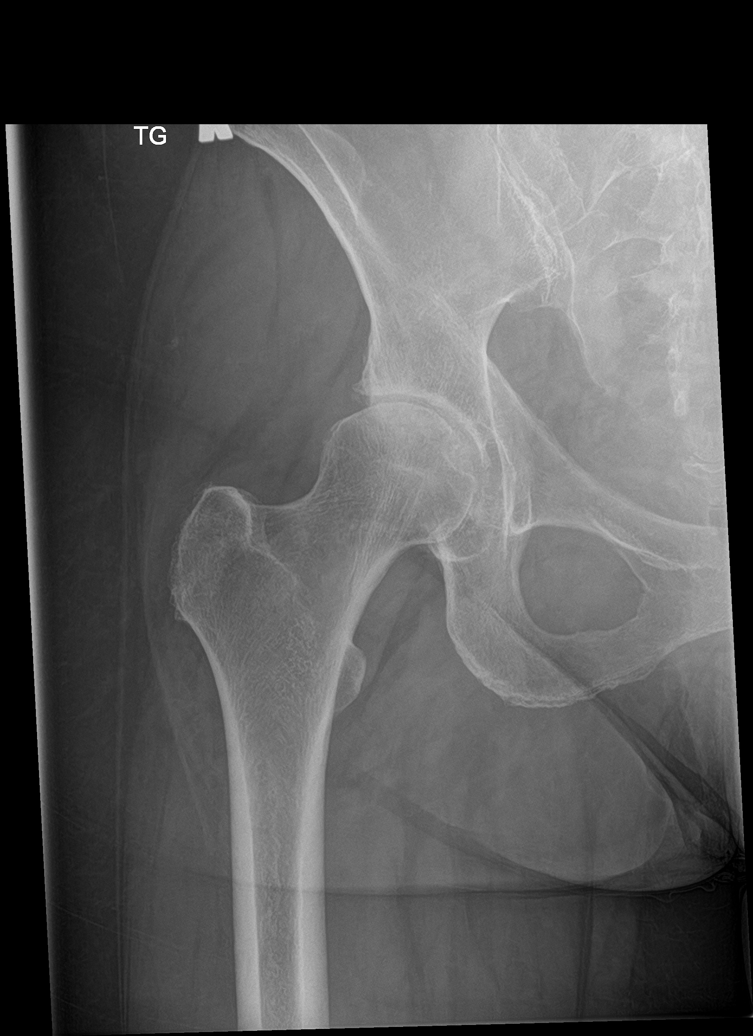

[5 of 5 positions shown; findings below may reference images not displayed]

FINDINGS: Degenerative changes lumbar spine and both hips. No acute bony
abnormality identified. No evidence of fracture dislocation.
IMPRESSION: No acute or focal abnormality.

## 2017-12-30 DIAGNOSIS — M1611 Unilateral primary osteoarthritis, right hip: Secondary | ICD-10-CM | POA: Insufficient documentation

## 2021-05-24 ENCOUNTER — Telehealth (INDEPENDENT_AMBULATORY_CARE_PROVIDER_SITE_OTHER): Payer: Medicare Other | Admitting: Family Medicine

## 2021-05-24 ENCOUNTER — Other Ambulatory Visit: Payer: Self-pay

## 2021-05-24 DIAGNOSIS — C44310 Basal cell carcinoma of skin of unspecified parts of face: Secondary | ICD-10-CM | POA: Diagnosis not present

## 2021-05-24 DIAGNOSIS — E079 Disorder of thyroid, unspecified: Secondary | ICD-10-CM | POA: Diagnosis not present

## 2021-05-24 DIAGNOSIS — E559 Vitamin D deficiency, unspecified: Secondary | ICD-10-CM | POA: Diagnosis not present

## 2021-05-24 DIAGNOSIS — I10 Essential (primary) hypertension: Secondary | ICD-10-CM | POA: Diagnosis not present

## 2021-05-24 DIAGNOSIS — Z0000 Encounter for general adult medical examination without abnormal findings: Secondary | ICD-10-CM

## 2021-05-24 MED ORDER — THYROID 60 MG PO TABS: 60.0000 mg | ORAL_TABLET | Freq: Every day | ORAL | 1 refills | Status: DC

## 2021-05-24 MED ORDER — LOSARTAN POTASSIUM 50 MG PO TABS: 50.0000 mg | ORAL_TABLET | Freq: Two times a day (BID) | ORAL | Status: DC

## 2021-05-24 NOTE — Assessment & Plan Note (Signed)
Supplement and monitor 

## 2021-05-24 NOTE — Progress Notes (Signed)
MyChart Video Visit    Virtual Visit via Video Note   This visit type was conducted due to national recommendations for restrictions regarding the COVID-19 Pandemic (e.g. social distancing) in an effort to limit this patient's exposure and mitigate transmission in our community. This patient is at least at moderate risk for complications without adequate follow up. This format is felt to be most appropriate for this patient at this time. Physical exam was limited by quality of the video and audio technology used for the visit. S. Chism, CMA was able to get the patient set up on a video visit.  Patient location: home Patient and provider in visit Provider location: Office  I discussed the limitations of evaluation and management by telemedicine and the availability of in person appointments. The patient expressed understanding and agreed to proceed.  Visit Date: 05/25/21  Today's healthcare provider: Penni Homans, MD     Subjective:    Patient ID: Megan Shah, female    DOB: [birth year], 65 y.o.   MRN: [id number]  No chief complaint on file.   HPI Patient is in today for video visit for reestablishment. She recently moved from [place] back to [place]. She reports that she tore her meniscus on her left knee. She has had a physical last July 2022. Denies CP/palp/SOB/HA/congestion/fevers/GI or GU c/o. Taking meds as prescribed  She was in a weight loss program in [place] and reports that she is doing well and experienced lower BP. She is currently on Losartan 50 mg BID and reports that since her BP improvements she only needs to take half her dosage. She endorses taking 1st and 2nd booster shots for covid.  She reports that she is having a regrowing lesion under her chin that previously was excised by dermatology and at the moment it is the size of a pinhead. She states that the lesion is hard to touch.   Past Medical History:  Diagnosis Date   Atrophic vaginitis  02/07/2016   Bunion of great toe of right foot 02/07/2016   Diverticulitis    History of chicken pox 06/07/2015   Hyperlipidemia    Hypertension    Left shoulder pain 12/25/2016   Overweight 06/07/2015   Palpitations 06/07/2015   Patient is Jehovah's Witness 06/07/2015   Post menopausal syndrome 06/13/2015   Preventative health care 12/25/2016   Right hip pain 06/13/2015   Thyroid disease    Transfusion of blood product refused for religious reason 06/07/2015   Vitamin D deficiency 06/07/2015    Past Surgical History:  Procedure Laterality Date   WISDOM TOOTH EXTRACTION  Age 27    Family History  Problem Relation Age of Onset   Arthritis Mother    Hypertension Mother    Hyperlipidemia Mother    Heart disease Mother        CAD with stents, afib   Atrial fibrillation Mother        flutter   Arthritis Father        broken hip, sepsis    Cancer Father        colon cancer, mirkel cell carcinoma   Bunion Father    Dementia Father    Atrial fibrillation Father    Arthritis Maternal Grandmother    Arthritis Maternal Grandfather    Cancer Maternal Grandfather 65       colon cancer   Arthritis Paternal Grandmother    Arthritis Paternal Grandfather    Cancer Paternal Grandfather  colon cancer   Colon polyps Brother        non cancer polyp   Cancer Brother        lymphoma in remission   Atrial fibrillation Brother    Alcohol abuse Son     Social History   Socioeconomic History   Marital status: Married    Spouse name: Not on file   Number of children: Not on file   Years of education: Not on file   Highest education level: Not on file  Occupational History   Occupation: volunteer  Tobacco Use   Smoking status: Former   Smokeless tobacco: Never   Tobacco comments:    Smoked for 2 years as a teenager.  Substance and Sexual Activity   Alcohol use: Yes    Alcohol/week: 0.0 standard drinks   Drug use: No   Sexual activity: Yes    Comment: lives with husband, moved  from [place], no dietary restrictions, minimizes wheat and dairy, volunteer work  Other Topics Concern   Not on file  Social History Narrative   Not on file   Social Determinants of Health   Financial Resource Strain: Not on file  Food Insecurity: Not on file  Transportation Needs: Not on file  Physical Activity: Not on file  Stress: Not on file  Social Connections: Not on file  Intimate Partner Violence: Not on file    Outpatient Medications Prior to Visit  Medication Sig Dispense Refill   Cholecalciferol 50 MCG (2000 UT) TABS Take 4,000 Units by mouth 2 (two) times daily.      DHEA 10 MG CAPS Take by mouth daily.     Magnesium Citrate POWD Take 615 mg by mouth 2 (two) times daily.     Multiple Vitamins-Minerals (MULTIVITAMIN ADULT PO) Take 1 tablet by mouth daily.     Omega-3 Fatty Acids (FISH OIL PO) Take by mouth.     progesterone 200 MG SUPP Place 200 mg vaginally at bedtime.     TURMERIC PO Take by mouth.     losartan (COZAAR) 50 MG tablet losartan 50 mg tablet     thyroid (ARMOUR) 60 MG tablet Take 1 tablet by mouth 5 days a week and 1/2 tab po on the other 2 days     Acetylcysteine (N-ACETYL-L-CYSTEINE PO) Take by mouth daily. (Patient not taking: Reported on 05/24/2021)     betamethasone dipropionate 0.05 % lotion Apply topically 2 (two) times daily. (Patient not taking: Reported on 05/24/2021) 60 mL 0   aspirin 81 MG chewable tablet Chew 81 mg by mouth daily.     bisoprolol-hydrochlorothiazide (ZIAC) 5-6.25 MG tablet Take 1 tablet by mouth daily. 90 tablet 1   conjugated estrogens (PREMARIN) vaginal cream Place 1 Applicatorful vaginally every other day. 42.5 g 1   cyclobenzaprine (FLEXERIL) 10 MG tablet Take 0.5-1 tablets (5-10 mg total) by mouth 2 (two) times daily as needed for muscle spasms. 30 tablet 1   hydrochlorothiazide (HYDRODIURIL) 25 MG tablet hydrochlorothiazide 25 mg tablet  Take 1 tablet every day by oral route.     NON FORMULARY Apply topically.      bisoprolol-hydrochlorothiazide (ZIAC) 2.5-6.25 MG per tablet 1 tablet      No facility-administered medications prior to visit.    No Known Allergies  Review of Systems  Constitutional:  Negative for chills, fever and malaise/fatigue.  HENT:  Negative for congestion, sinus pain and sore throat.   Eyes:  Negative for blurred vision.  Respiratory:  Negative for cough  and shortness of breath.   Cardiovascular:  Negative for chest pain, palpitations and leg swelling.  Gastrointestinal:  Negative for blood in stool, diarrhea, nausea and vomiting.  Genitourinary:  Negative for flank pain and frequency.  Musculoskeletal:  Negative for back pain.  Skin:  Negative for rash.  Neurological:  Negative for headaches.      Objective:    Physical Exam Constitutional:      General: She is not in acute distress.    Appearance: Normal appearance. She is not ill-appearing or toxic-appearing.  HENT:     Head: Normocephalic and atraumatic.     Right Ear: Tympanic membrane, ear canal and external ear normal.     Left Ear: Tympanic membrane, ear canal and external ear normal.     Nose: No congestion or rhinorrhea.  Eyes:     Extraocular Movements: Extraocular movements intact.     Pupils: Pupils are equal, round, and reactive to light.  Cardiovascular:     Rate and Rhythm: Normal rate and regular rhythm.     Pulses: Normal pulses.     Heart sounds: Normal heart sounds. No murmur heard. Pulmonary:     Effort: Pulmonary effort is normal. No respiratory distress.     Breath sounds: Normal breath sounds. No wheezing, rhonchi or rales.  Abdominal:     General: Bowel sounds are normal.     Palpations: Abdomen is soft. There is no mass.     Tenderness: There is no abdominal tenderness. There is no guarding.     Hernia: No hernia is present.  Musculoskeletal:        General: Normal range of motion.     Cervical back: Normal range of motion and neck supple.  Skin:    General: Skin is warm and dry.   Neurological:     Mental Status: She is alert and oriented to person, place, and time.  Psychiatric:        Behavior: Behavior normal.    There were no vitals taken for this visit. Wt Readings from Last 3 Encounters:  01/08/17 199 lb (90.3 kg)  12/28/16 204 lb (92.5 kg)  12/25/16 204 lb 6.4 oz (92.7 kg)    Diabetic Foot Exam - Simple   No data filed    Lab Results  Component Value Date   WBC 6.8 01/04/2017   HGB 14.4 01/04/2017   HCT 41.5 01/04/2017   PLT 324.0 01/04/2017   GLUCOSE 98 01/04/2017   CHOL 225 (H) 01/04/2017   TRIG 264.0 (H) 01/04/2017   HDL 59.20 01/04/2017   LDLDIRECT 136.0 01/04/2017   ALT 19 01/04/2017   AST 16 01/04/2017   NA 136 01/04/2017   K 3.9 01/04/2017   CL 101 01/04/2017   CREATININE 0.60 01/04/2017   BUN 12 01/04/2017   CO2 29 01/04/2017   TSH 0.62 01/04/2017    Lab Results  Component Value Date   TSH 0.62 01/04/2017   Lab Results  Component Value Date   WBC 6.8 01/04/2017   HGB 14.4 01/04/2017   HCT 41.5 01/04/2017   MCV 86.5 01/04/2017   PLT 324.0 01/04/2017   Lab Results  Component Value Date   NA 136 01/04/2017   K 3.9 01/04/2017   CO2 29 01/04/2017   GLUCOSE 98 01/04/2017   BUN 12 01/04/2017   CREATININE 0.60 01/04/2017   BILITOT 0.6 01/04/2017   ALKPHOS 56 01/04/2017   AST 16 01/04/2017   ALT 19 01/04/2017   PROT 7.5 01/04/2017  ALBUMIN 4.7 01/04/2017   CALCIUM 10.1 01/04/2017   GFR 107.92 01/04/2017   Lab Results  Component Value Date   CHOL 225 (H) 01/04/2017   Lab Results  Component Value Date   HDL 59.20 01/04/2017   No results found for: [facility] Lab Results  Component Value Date   TRIG 264.0 (H) 01/04/2017   Lab Results  Component Value Date   CHOLHDL 4 01/04/2017   No results found for: HGBA1C     Assessment & Plan:   Problem List Items Addressed This Visit     Hypertension    Monitor and report any concerns, no changes to meds. Encouraged heart healthy diet such as the DASH diet and  exercise as tolerated.       Relevant Medications   losartan (COZAAR) 50 MG tablet   Thyroid disease    Given refill on Armour Thyroid 60 mg to take daily      Relevant Medications   thyroid (ARMOUR) 60 MG tablet   Vitamin D deficiency    Supplement and monitor      Preventative health care    She reports she had her physical with her PCP in [place] in July before moving back to [place]. She reports having pap, mgm, colonoscopy and lab work will request records including results, notes and immunizations. She has had all 4 Covid shots so far      Other Visit Diagnoses     BCC (basal cell carcinoma), face    -  Primary   Relevant Orders   Ambulatory referral to Dermatology         Meds ordered this encounter  Medications   thyroid (ARMOUR) 60 MG tablet    Sig: Take 1 tablet (60 mg total) by mouth daily before breakfast. Take 1 tablet by mouth 5 days a week and 1/2 tab po on the other 2 days    Dispense:  90 tablet    Refill:  1   losartan (COZAAR) 50 MG tablet    Sig: Take 1 tablet (50 mg total) by mouth 2 (two) times daily.     I discussed the assessment and treatment plan with the patient. The patient was provided an opportunity to ask questions and all were answered. The patient agreed with the plan and demonstrated an understanding of the instructions.   The patient was advised to call back or seek an in-person evaluation if the symptoms worsen or if the condition fails to improve as anticipated.  I provided 26 minutes of face-to-face time during this encounter.   Penni Homans, MD [facility] [phone] (phone) [phone] (fax)  [place], Suezanne Jacquet, acting as a scribe for Penni Homans, MD, have documented all relevent documentation on behalf of Penni Homans, MD, as directed by Penni Homans, MD while in the presence of Penni Homans, MD. DO:05/25/21.  I, Mosie Lukes, MD personally  performed the services described in this documentation. All medical record entries made by the scribe were at my direction and in my presence. I have reviewed the chart and agree that the record reflects my personal performance and is accurate and complete

## 2021-05-24 NOTE — Patient Instructions (Signed)
  Paxlovid or Molnupiravir are the new COVID medication we can give you if you get COVID so make sure you test if you have symptoms because we have to treat by day 5 of symptoms for it to be effective. If you are positive let us know so we can treat. If a home test is negative and your symptoms are persistent get a PCR test. Can check testing locations at [email or web address] If you are positive we will make an appointment with [place] and we will send in Paxlovid if you would like it. Check with your pharmacy before we meet to confirm they have it in stock, if they do not then we can get the prescription at the [facility]

## 2021-05-24 NOTE — Assessment & Plan Note (Signed)
Given refill on Armour Thyroid 60 mg to take daily

## 2021-05-24 NOTE — Assessment & Plan Note (Signed)
Monitor and report any concerns, no changes to meds. Encouraged heart healthy diet such as the DASH diet and exercise as tolerated.  ?

## 2021-05-25 NOTE — Assessment & Plan Note (Addendum)
She reports she had her physical with her PCP in [place] in July before moving back to [place]. She reports having pap, mgm, colonoscopy and lab work will request records including results, notes and immunizations. She has had all 4 Covid shots so far

## 2021-08-01 ENCOUNTER — Telehealth: Payer: Self-pay | Admitting: *Deleted

## 2021-08-01 NOTE — Telephone Encounter (Signed)
Call Type Triage / Clinical Relationship To Patient Self Return Phone Number [phone] (Primary) Chief Complaint Sore Throat Reason for Call Symptomatic / Request for Health Information Initial Comment Caller states she has COVID and wants to know if she needs to be prescribed medication for it. Bad cough, sore throat and fever. Translation No Nurse Assessment Nurse: Gloriann Loan, RN, Sharyn Lull Date/Time (Eastern Time): 07/31/2021 9:48:22 AM Confirm and document reason for call. If symptomatic, describe symptoms. ---Caller states she has COVID and wants to know if she needs to be prescribed medication for it. Bad cough, sore throat and fever. Runs 97 normally but running in high 98's. Gagging on the mucus. Cough is what scares her. Not had COVID yet. She has taken precaution. Symptoms started Thursday but was negative then.  07/31/2021 9:59:56 AM Go to ED Now Yes Gloriann Loan, RN, Sharyn Lull

## 2021-08-01 NOTE — Telephone Encounter (Signed)
Patient declined medication at this time.  She stated that she is feeling better.

## 2021-08-15 ENCOUNTER — Ambulatory Visit: Payer: Medicare Other | Admitting: Dermatology

## 2021-08-30 ENCOUNTER — Encounter: Payer: Self-pay | Admitting: Family Medicine

## 2021-08-31 ENCOUNTER — Other Ambulatory Visit: Payer: Self-pay

## 2021-08-31 ENCOUNTER — Other Ambulatory Visit: Payer: Self-pay | Admitting: Family Medicine

## 2021-08-31 MED ORDER — BETAMETHASONE DIPROPIONATE 0.05 % EX LOTN: TOPICAL_LOTION | Freq: Two times a day (BID) | CUTANEOUS | 0 refills | Status: DC

## 2021-08-31 MED ORDER — AMLODIPINE BESYLATE 2.5 MG PO TABS: 2.5000 mg | ORAL_TABLET | Freq: Every day | ORAL | 2 refills | Status: DC

## 2021-08-31 NOTE — Telephone Encounter (Signed)
Pt would like the amlodipine sent in. She also would like betamethasone dipropionate 0.05 % lotion but she would like the smallest amount sent in.

## 2021-09-01 ENCOUNTER — Other Ambulatory Visit: Payer: Self-pay

## 2021-09-01 ENCOUNTER — Telehealth: Payer: Self-pay | Admitting: Family Medicine

## 2021-09-01 MED ORDER — BETAMETHASONE DIPROPIONATE 0.05 % EX LOTN: TOPICAL_LOTION | Freq: Two times a day (BID) | CUTANEOUS | 0 refills | Status: AC

## 2021-09-01 NOTE — Telephone Encounter (Signed)
pt called wanting to know if she should stop taking old bp meds and start the new one. as well as having other med switched to a different pharmacy. please advise.  Medication:  betamethasone dipropionate 0.05 % lotion [[id number]]     Has the patient contacted their pharmacy? No. (If no, request that the patient contact the pharmacy for the refill.) (If yes, when and what did the pharmacy advise?)     Preferred Pharmacy (with phone number or street name):  [place], [place] [address] [phone]  Agent: Please be advised that RX refills may take up to 3 business days. We ask that you follow-up with your pharmacy.

## 2021-10-03 ENCOUNTER — Ambulatory Visit (INDEPENDENT_AMBULATORY_CARE_PROVIDER_SITE_OTHER): Payer: Medicare Other | Admitting: Family Medicine

## 2021-10-03 ENCOUNTER — Encounter: Payer: Self-pay | Admitting: Family Medicine

## 2021-10-03 VITALS — BP 116/68 | HR 80 | Temp 98.2°F | Resp 16 | Ht 67.0 in | Wt 193.0 lb

## 2021-10-03 DIAGNOSIS — E785 Hyperlipidemia, unspecified: Secondary | ICD-10-CM

## 2021-10-03 DIAGNOSIS — M25551 Pain in right hip: Secondary | ICD-10-CM | POA: Diagnosis not present

## 2021-10-03 DIAGNOSIS — E559 Vitamin D deficiency, unspecified: Secondary | ICD-10-CM | POA: Diagnosis not present

## 2021-10-03 DIAGNOSIS — N952 Postmenopausal atrophic vaginitis: Secondary | ICD-10-CM

## 2021-10-03 DIAGNOSIS — M25552 Pain in left hip: Secondary | ICD-10-CM

## 2021-10-03 DIAGNOSIS — I10 Essential (primary) hypertension: Secondary | ICD-10-CM | POA: Diagnosis not present

## 2021-10-03 DIAGNOSIS — E782 Mixed hyperlipidemia: Secondary | ICD-10-CM

## 2021-10-03 DIAGNOSIS — Z0000 Encounter for general adult medical examination without abnormal findings: Secondary | ICD-10-CM

## 2021-10-03 MED ORDER — VALSARTAN 160 MG PO TABS: 160.0000 mg | ORAL_TABLET | Freq: Every day | ORAL | 1 refills | Status: DC

## 2021-10-03 MED ORDER — ESTROGENS CONJUGATED 0.625 MG/GM VA CREA: 1.0000 | TOPICAL_CREAM | Freq: Every day | VAGINAL | 12 refills | Status: DC

## 2021-10-03 NOTE — Assessment & Plan Note (Addendum)
Well controlled but she notes it can run slightly higher at home. Encouraged heart healthy diet such as the DASH diet and exercise as tolerated. At home she reports 141/89 qhs, 124/71 in afternoon. Switch Losartan to Valsartan to 160 mg daily and continue Amlodipine 2.5 mg qhs

## 2021-10-03 NOTE — Assessment & Plan Note (Signed)
Encourage heart healthy diet such as MIND or DASH diet, increase exercise, avoid trans fats, simple carbohydrates and processed foods, consider a krill or fish or flaxseed oil cap daily.  °

## 2021-10-03 NOTE — Addendum Note (Signed)
Addended by: Penni Homans A on: 10/03/2021 08:26 PM   Modules accepted: Level of Service

## 2021-10-03 NOTE — Patient Instructions (Signed)

## 2021-10-03 NOTE — Assessment & Plan Note (Signed)
Slowly worsening no trauma or fall, xray of hip ordered. Offered referral but declines for now. Stay as active as able

## 2021-10-03 NOTE — Assessment & Plan Note (Signed)
Supplement and monitor 

## 2021-10-03 NOTE — Assessment & Plan Note (Signed)
Will try Premarin cream applied to vaginal mucosa in small amounts twice weekly, prescription given

## 2021-10-03 NOTE — Assessment & Plan Note (Deleted)
Patient encouraged to maintain heart healthy diet, regular exercise, adequate sleep. Consider daily probiotics. Take medications as prescribed. Will return in am for fasting labs, agrees to Pap with next CPE, repeat Dexa scan this year. MGM due in coming year but is utd. Prevnar 20, Shingrix and COVID booster are recommended

## 2021-10-03 NOTE — Progress Notes (Addendum)
Subjective:   By signing my name below, I, Megan Shah, attest that this documentation has been prepared under the direction and in the presence of Megan Lukes, MD. 10/03/2021       Patient ID: Megan Shah, female    DOB: 1956-08-01, 66 y.o.   MRN: [id number]  Chief Complaint  Patient presents with   4 months follow up   Hypertension    HPI Patient is in today for an office visit and 4 month f/u.  She checks her blood pressure readings at home and they have been high. Systolic has been in the 778-242. Notes that they are lower in the afternoon and higher in the night. She does not think 50 mg losartan is working. Her blood pressure is stable at this visit. BP Readings from Last 3 Encounters:  10/03/21 116/68  04/17/17 118/62  01/08/17 120/70    Her mother passed away in Jul 05, 2021.  She is wondering if the 200 mg progesterone in olive oil is not allowing her to sleep properly through the night.She normally wakes up at 4 am. She adds that when she started a new progesterone from a new company in [place], her right breast was tender and painful so she stopped.  She had a right hip arthroplasty and thinks she will need a left hip replacement soon.  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   Bunion of great toe of right foot 02/07/2016   Diverticulitis    History of chicken pox 06/07/2015   Hyperlipidemia    Hypertension    Left shoulder pain 12/25/2016   Overweight 06/07/2015   Palpitations 06/07/2015   Patient is Jehovah's Witness 06/07/2015   Post menopausal syndrome 06/13/2015   Preventative health care 12/25/2016   Right hip pain 06/13/2015   Thyroid disease    Transfusion of blood product refused for religious reason 06/07/2015   Vitamin D deficiency 06/07/2015    Past Surgical History:  Procedure Laterality Date   WISDOM TOOTH EXTRACTION  Age 31    Family History  Problem Relation Age of Onset   Arthritis Mother     Hypertension Mother    Hyperlipidemia Mother    Heart disease Mother        CAD with stents, afib   Atrial fibrillation Mother        flutter   Arthritis Father        broken hip, sepsis    Cancer Father        colon cancer, mirkel cell carcinoma   Bunion Father    Dementia Father    Atrial fibrillation Father    Arthritis Maternal Grandmother    Arthritis Maternal Grandfather    Cancer Maternal Grandfather 81       colon cancer   Arthritis Paternal Grandmother    Arthritis Paternal Grandfather    Cancer Paternal Grandfather        colon cancer   Colon polyps Brother        non cancer polyp   Cancer Brother        lymphoma in remission   Atrial fibrillation Brother    Alcohol abuse Son     Social History   Socioeconomic History   Marital status: Married    Spouse name: Not on file   Number of children: Not on file   Years of education: Not on file   Highest education level: Not on file  Occupational History  Occupation: Psychologist, occupational  Tobacco Use   Smoking status: Former   Smokeless tobacco: Never   Tobacco comments:    Smoked for 2 years as a teenager.  Substance and Sexual Activity   Alcohol use: Yes    Alcohol/week: 0.0 standard drinks   Drug use: No   Sexual activity: Yes    Comment: lives with husband, moved from [place], no dietary restrictions, minimizes wheat and dairy, volunteer work  Other Topics Concern   Not on file  Social History Narrative   Not on file   Social Determinants of Health   Financial Resource Strain: Not on file  Food Insecurity: Not on file  Transportation Needs: Not on file  Physical Activity: Not on file  Stress: Not on file  Social Connections: Not on file  Intimate Partner Violence: Not on file    Outpatient Medications Prior to Visit  Medication Sig Dispense Refill   amLODipine (NORVASC) 2.5 MG tablet Take 1 tablet (2.5 mg total) by mouth daily. 30 tablet 2   betamethasone dipropionate 0.05 % lotion Apply topically 2  (two) times daily. 60 mL 0   Cholecalciferol 50 MCG (2000 UT) TABS Take 4,000 Units by mouth 2 (two) times daily.      DHEA 10 MG CAPS Take by mouth daily.     Magnesium Citrate POWD Take 615 mg by mouth 2 (two) times daily.     Multiple Vitamins-Minerals (MULTIVITAMIN ADULT PO) Take 1 tablet by mouth daily.     Omega-3 Fatty Acids (FISH OIL PO) Take by mouth.     thyroid (ARMOUR) 60 MG tablet Take 1 tablet (60 mg total) by mouth daily before breakfast. Take 1 tablet by mouth 5 days a week and 1/2 tab po on the other 2 days 90 tablet 1   TURMERIC PO Take by mouth.     Acetylcysteine (N-ACETYL-L-CYSTEINE PO) Take by mouth daily.     losartan (COZAAR) 50 MG tablet Take 1 tablet (50 mg total) by mouth 2 (two) times daily.     progesterone 200 MG SUPP Place 200 mg vaginally at bedtime.     No facility-administered medications prior to visit.    No Known Allergies  Review of Systems  Constitutional:  Negative for fever and malaise/fatigue.  HENT:  Negative for congestion.   Eyes:  Negative for redness.  Respiratory:  Negative for shortness of breath.   Cardiovascular:  Negative for chest pain, palpitations and leg swelling.  Gastrointestinal:  Negative for abdominal pain, blood in stool and nausea.  Genitourinary:  Negative for dysuria and frequency.  Musculoskeletal:  Positive for joint pain (left hip). Negative for falls.  Skin:  Negative for rash.  Neurological:  Negative for dizziness, loss of consciousness and headaches.  Endo/Heme/Allergies:  Negative for polydipsia.  Psychiatric/Behavioral:  Negative for depression. The patient is not nervous/anxious.       Objective:    Physical Exam Constitutional:      General: She is not in acute distress.    Appearance: She is well-developed.  HENT:     Head: Normocephalic and atraumatic.  Eyes:     Conjunctiva/sclera: Conjunctivae normal.  Neck:     Thyroid: No thyromegaly.  Cardiovascular:     Rate and Rhythm: Normal rate and  regular rhythm.     Heart sounds: Normal heart sounds. No murmur heard. Pulmonary:     Effort: Pulmonary effort is normal. No respiratory distress.     Breath sounds: Normal breath sounds.  Abdominal:  General: Bowel sounds are normal. There is no distension.     Palpations: Abdomen is soft. There is no mass.     Tenderness: There is no abdominal tenderness.  Musculoskeletal:     Cervical back: Neck supple.  Lymphadenopathy:     Cervical: No cervical adenopathy.  Skin:    General: Skin is warm and dry.  Neurological:     Mental Status: She is alert and oriented to person, place, and time.  Psychiatric:        Behavior: Behavior normal.    BP 116/68    Pulse 80    Temp 98.2 F (36.8 C)    Resp 16    Ht 5\' 7"  (1.702 m)    Wt 193 lb (87.5 kg)    SpO2 97%    BMI 30.23 kg/m  Wt Readings from Last 3 Encounters:  10/03/21 193 lb (87.5 kg)  01/08/17 199 lb (90.3 kg)  12/28/16 204 lb (92.5 kg)    Diabetic Foot Exam - Simple   No data filed    Lab Results  Component Value Date   WBC 6.8 01/04/2017   HGB 14.4 01/04/2017   HCT 41.5 01/04/2017   PLT 324.0 01/04/2017   GLUCOSE 98 01/04/2017   CHOL 225 (H) 01/04/2017   TRIG 264.0 (H) 01/04/2017   HDL 59.20 01/04/2017   LDLDIRECT 136.0 01/04/2017   ALT 19 01/04/2017   AST 16 01/04/2017   NA 136 01/04/2017   K 3.9 01/04/2017   CL 101 01/04/2017   CREATININE 0.60 01/04/2017   BUN 12 01/04/2017   CO2 29 01/04/2017   TSH 0.62 01/04/2017    Lab Results  Component Value Date   TSH 0.62 01/04/2017   Lab Results  Component Value Date   WBC 6.8 01/04/2017   HGB 14.4 01/04/2017   HCT 41.5 01/04/2017   MCV 86.5 01/04/2017   PLT 324.0 01/04/2017   Lab Results  Component Value Date   NA 136 01/04/2017   K 3.9 01/04/2017   CO2 29 01/04/2017   GLUCOSE 98 01/04/2017   BUN 12 01/04/2017   CREATININE 0.60 01/04/2017   BILITOT 0.6 01/04/2017   ALKPHOS 56 01/04/2017   AST 16 01/04/2017   ALT 19 01/04/2017   PROT 7.5  01/04/2017   ALBUMIN 4.7 01/04/2017   CALCIUM 10.1 01/04/2017   GFR 107.92 01/04/2017   Lab Results  Component Value Date   CHOL 225 (H) 01/04/2017   Lab Results  Component Value Date   HDL 59.20 01/04/2017   No results found for: [facility] Lab Results  Component Value Date   TRIG 264.0 (H) 01/04/2017   Lab Results  Component Value Date   CHOLHDL 4 01/04/2017   No results found for: HGBA1C     Assessment & Plan:   Problem List Items Addressed This Visit     Hypertension    Well controlled but she notes it can run slightly higher at home. Encouraged heart healthy diet such as the DASH diet and exercise as tolerated. At home she reports 141/89 qhs, 124/71 in afternoon. Switch Losartan to Valsartan to 160 mg daily and continue Amlodipine 2.5 mg qhs      Relevant Medications   valsartan (DIOVAN) 160 MG tablet   Other Relevant Orders   CBC   TSH   Hyperlipidemia    Encourage heart healthy diet such as MIND or DASH diet, increase exercise, avoid trans fats, simple carbohydrates and processed foods, consider a krill or fish  or flaxseed oil cap daily.       Relevant Medications   valsartan (DIOVAN) 160 MG tablet   Other Relevant Orders   Comprehensive metabolic panel   Lipid panel   Vitamin D deficiency    Supplement and monitor      Relevant Orders   VITAMIN D 25 Hydroxy (Vit-D Deficiency, Fractures)   Right hip pain    Slowly worsening no trauma or fall, xray of hip ordered. Offered referral but declines for now. Stay as active as able      Atrophic vaginitis    Will try Premarin cream applied to vaginal mucosa in small amounts twice weekly, prescription given      Relevant Medications   conjugated estrogens (PREMARIN) vaginal cream   Preventative health care - Primary   Other Visit Diagnoses     Left hip pain       Relevant Orders   DG Hip Unilat W OR W/O Pelvis 2-3 Views Left       Meds ordered this encounter  Medications   valsartan (DIOVAN) 160  MG tablet    Sig: Take 1 tablet (160 mg total) by mouth daily.    Dispense:  90 tablet    Refill:  1   conjugated estrogens (PREMARIN) vaginal cream    Sig: Place 1 Applicatorful vaginally daily.    Dispense:  42.5 g    Refill:  12    I,Megan Shah,acting as a scribe for Penni Homans, MD.,have documented all relevant documentation on the behalf of Penni Homans, MD,as directed by  Penni Homans, MD while in the presence of Penni Homans, MD.   I, Megan Lukes, MD., personally preformed the services described in this documentation.  All medical record entries made by the scribe were at my direction and in my presence.  I have reviewed the chart and discharge instructions (if applicable) and agree that the record reflects my personal performance and is accurate and complete. 10/03/2021

## 2021-10-04 ENCOUNTER — Encounter: Payer: Self-pay | Admitting: Family Medicine

## 2021-10-05 NOTE — Telephone Encounter (Signed)
Still going to wait on information

## 2021-10-11 ENCOUNTER — Encounter: Payer: Self-pay | Admitting: Family Medicine

## 2021-10-12 ENCOUNTER — Other Ambulatory Visit: Payer: Self-pay

## 2021-10-12 MED ORDER — AMLODIPINE BESYLATE 5 MG PO TABS: 5.0000 mg | ORAL_TABLET | Freq: Every day | ORAL | 2 refills | Status: DC

## 2021-10-12 NOTE — Telephone Encounter (Signed)
Done

## 2021-10-14 ENCOUNTER — Other Ambulatory Visit: Payer: Self-pay

## 2021-10-14 MED ORDER — AMLODIPINE BESYLATE 5 MG PO TABS: 5.0000 mg | ORAL_TABLET | Freq: Every day | ORAL | 2 refills | Status: DC

## 2021-10-18 ENCOUNTER — Telehealth: Payer: Self-pay | Admitting: Family Medicine

## 2021-10-18 DIAGNOSIS — H524 Presbyopia: Secondary | ICD-10-CM | POA: Diagnosis not present

## 2021-10-18 NOTE — Telephone Encounter (Signed)
Pt would like for someone to return her phone call regarding meds   Please advise.

## 2021-10-19 NOTE — Telephone Encounter (Signed)
Spoke with pt, she would like to know if she should be taking ziac. Also states that amlodipine is making her feet burn at the bottom

## 2021-10-20 ENCOUNTER — Encounter: Payer: Self-pay | Admitting: Family Medicine

## 2021-10-20 ENCOUNTER — Other Ambulatory Visit: Payer: Self-pay

## 2021-10-20 ENCOUNTER — Ambulatory Visit (HOSPITAL_BASED_OUTPATIENT_CLINIC_OR_DEPARTMENT_OTHER)
Admission: RE | Admit: 2021-10-20 | Discharge: 2021-10-20 | Disposition: A | Discharge status: Home / Self Care | Payer: Medicare Other | Source: Ambulatory Visit | Attending: Family Medicine | Admitting: Family Medicine

## 2021-10-20 ENCOUNTER — Ambulatory Visit (INDEPENDENT_AMBULATORY_CARE_PROVIDER_SITE_OTHER): Payer: Medicare Other | Admitting: Family Medicine

## 2021-10-20 ENCOUNTER — Ambulatory Visit: Payer: Medicare Other

## 2021-10-20 DIAGNOSIS — M25552 Pain in left hip: Secondary | ICD-10-CM | POA: Diagnosis not present

## 2021-10-20 DIAGNOSIS — I10 Essential (primary) hypertension: Secondary | ICD-10-CM | POA: Diagnosis not present

## 2021-10-20 NOTE — Progress Notes (Signed)
Pt here for Blood pressure check per Dr. Charlett Blake  Pt currently takes: amLODipine (NORVASC) 5 MG-Take 1 tablet (5 mg total) by mouth daily.    Pt reports compliance with medication.  BP today @ =118/74 HR =93  Pt advised per (DOD) Dr. Etter Sjogren, Pt is to continue medication regimen and to follow up with Dr. Charlett Blake in 3-4 months.

## 2021-10-24 ENCOUNTER — Encounter: Payer: Self-pay | Admitting: Family Medicine

## 2021-10-25 ENCOUNTER — Ambulatory Visit: Payer: Medicare Other

## 2021-10-26 ENCOUNTER — Other Ambulatory Visit: Payer: Self-pay

## 2021-10-26 MED ORDER — BISOPROLOL-HYDROCHLOROTHIAZIDE 2.5-6.25 MG PO TABS: 1.0000 | ORAL_TABLET | Freq: Two times a day (BID) | ORAL | 3 refills | Status: DC

## 2021-10-26 NOTE — Telephone Encounter (Signed)
Pt wanted to restart ziac 2.5 BID and she will call back in to schedule BP check and lab appt.

## 2021-11-03 NOTE — Telephone Encounter (Signed)
Pt scheduled for 11/07/21 with Percell Miller ?

## 2021-11-07 ENCOUNTER — Ambulatory Visit (INDEPENDENT_AMBULATORY_CARE_PROVIDER_SITE_OTHER): Payer: Medicare Other | Admitting: Medical

## 2021-11-07 ENCOUNTER — Encounter: Payer: Self-pay | Admitting: Medical

## 2021-11-07 VITALS — BP 119/62 | HR 64 | Resp 18 | Ht 67.0 in

## 2021-11-07 DIAGNOSIS — I10 Essential (primary) hypertension: Secondary | ICD-10-CM | POA: Diagnosis not present

## 2021-11-07 DIAGNOSIS — L723 Sebaceous cyst: Secondary | ICD-10-CM | POA: Diagnosis not present

## 2021-11-07 MED ORDER — BISOPROLOL-HYDROCHLOROTHIAZIDE 2.5-6.25 MG PO TABS: 1.0000 | ORAL_TABLET | Freq: Two times a day (BID) | ORAL | 3 refills | Status: DC

## 2021-11-07 MED ORDER — DOXYCYCLINE HYCLATE 100 MG PO TABS: 100.0000 mg | ORAL_TABLET | Freq: Two times a day (BID) | ORAL | 0 refills | Status: DC

## 2021-11-07 NOTE — Progress Notes (Signed)
? ?Subjective:  ? ? Patient ID: Megan Shah, female    DOB: [birth year], 66 y.o.   MRN: [id number] ? ?HPI ? ? ?Pt has small bump in her rt eye brow area. Present for 2 months. She  states dermatologist that she was referred to was initially scheduled for march then got delayed until April. The area is occasionally tender.  ? ?Pt had lesion on chin which was biopsied had biopsy procedure and then had significant scar after Mohs procedure.  ? ?Htn- bp well controlled. ? ?Review of Systems  ?Constitutional:  Negative for chills and fatigue.  ?Respiratory:  Negative for chest tightness, shortness of breath and wheezing.   ?Cardiovascular:  Negative for chest pain and palpitations.  ?Gastrointestinal:  Negative for abdominal pain.  ?Musculoskeletal:  Negative for back pain and myalgias.  ?Skin:   ?     See hpi.  ?Neurological:  Negative for dizziness and headaches.  ?Hematological:  Negative for adenopathy. Does not bruise/bleed easily.  ?Psychiatric/Behavioral:  Negative for behavioral problems and decreased concentration.   ? ?Past Medical History:  ?Diagnosis Date  ? Atrophic vaginitis 02/07/2016  ? Bunion of great toe of right foot 02/07/2016  ? Diverticulitis   ? History of chicken pox 06/07/2015  ? Hyperlipidemia   ? Hypertension   ? Left shoulder pain 12/25/2016  ? Overweight 06/07/2015  ? Palpitations 06/07/2015  ? Patient is Jehovah's Witness 06/07/2015  ? Post menopausal syndrome 06/13/2015  ? Preventative health care 12/25/2016  ? Right hip pain 06/13/2015  ? Thyroid disease   ? Transfusion of blood product refused for religious reason 06/07/2015  ? Vitamin D deficiency 06/07/2015  ? ?  ?Social History  ? ?Socioeconomic History  ? Marital status: Married  ?  Spouse name: Not on file  ? Number of children: Not on file  ? Years of education: Not on file  ? Highest education level: Not on file  ?Occupational History  ? Occupation: Psychologist, occupational  ?Tobacco Use  ? Smoking status: Former  ? Smokeless tobacco: Never  ? Tobacco  comments:  ?  Smoked for 2 years as a teenager.  ?Substance and Sexual Activity  ? Alcohol use: Yes  ?  Alcohol/week: 0.0 standard drinks  ? Drug use: No  ? Sexual activity: Yes  ?  Comment: lives with husband, moved from [place], no dietary restrictions, minimizes wheat and dairy, volunteer work  ?Other Topics Concern  ? Not on file  ?Social History Narrative  ? Not on file  ? ?Social Determinants of Health  ? ?Financial Resource Strain: Not on file  ?Food Insecurity: Not on file  ?Transportation Needs: Not on file  ?Physical Activity: Not on file  ?Stress: Not on file  ?Social Connections: Not on file  ?Intimate Partner Violence: Not on file  ? ? ?Past Surgical History:  ?Procedure Laterality Date  ? WISDOM TOOTH EXTRACTION  Age 57  ? ? ?Family History  ?Problem Relation Age of Onset  ? Arthritis Mother   ? Hypertension Mother   ? Hyperlipidemia Mother   ? Heart disease Mother   ?     CAD with stents, afib  ? Atrial fibrillation Mother   ?     flutter  ? Arthritis Father   ?     broken hip, sepsis   ? Cancer Father   ?     colon cancer, mirkel cell carcinoma  ? Bunion Father   ? Dementia Father   ? Atrial fibrillation Father   ?  Cancer Brother   ?     kidney  ? Colon polyps Brother   ?     non cancer polyp  ? Cancer Brother   ?     lymphoma in remission  ? Atrial fibrillation Brother   ? Arthritis Maternal Grandmother   ? Arthritis Maternal Grandfather   ? Cancer Maternal Grandfather 63  ?     colon cancer  ? Arthritis Paternal Grandmother   ? Arthritis Paternal Grandfather   ? Cancer Paternal Grandfather   ?     colon cancer  ? ? ?No Known Allergies ? ?Current Outpatient Medications on File Prior to Visit  ?Medication Sig Dispense Refill  ? betamethasone dipropionate 0.05 % lotion Apply topically 2 (two) times daily. 60 mL 0  ? bisoprolol-hydrochlorothiazide (ZIAC) 2.5-6.25 MG tablet Take 1 tablet by mouth 2 (two) times daily. 60 tablet 3  ? Cholecalciferol 50 MCG (2000 UT) TABS Take 4,000 Units by mouth 2 (two)  times daily.     ? conjugated estrogens (PREMARIN) vaginal cream Place 1 Applicatorful vaginally daily. 42.5 g 12  ? DHEA 10 MG CAPS Take by mouth daily.    ? Magnesium Citrate POWD Take 615 mg by mouth 2 (two) times daily.    ? Multiple Vitamins-Minerals (MULTIVITAMIN ADULT PO) Take 1 tablet by mouth daily.    ? Omega-3 Fatty Acids (FISH OIL PO) Take by mouth.    ? thyroid (ARMOUR) 60 MG tablet Take 1 tablet (60 mg total) by mouth daily before breakfast. Take 1 tablet by mouth 5 days a week and 1/2 tab po on the other 2 days 90 tablet 1  ? TURMERIC PO Take by mouth.    ? ?No current facility-administered medications on file prior to visit.  ? ? ?BP 119/62   Pulse 64   Resp 18   Ht '5\' 7"'$  (1.702 m)   SpO2 99%   BMI 30.23 kg/m?  ?  ?   ?Objective:  ? Physical Exam ? ?General ?Mental Status- Alert. General Appearance- Not in acute distress.  ? ?Skin ? ?Lt side eye brow. Mid aspect of eye brow small circular lump about 4 mm in diameter.  ? ? ? ?Chest and Lung Exam ?Auscultation: ?Breath Sounds:-Normal. ? ?Cardiovascular ?Auscultation:Rythm- Regular. ?Murmurs & Other Heart Sounds:Auscultation of the heart reveals- No Murmurs. ? ? ?Neurologic ?Cranial Nerve exam:- CN III-XII intact(No nystagmus), symmetric smile. ?Strength:- 5/5 equal and symmetric strength both upper and lower extremities.  ? ? ?   ?Assessment & Plan:  ?You appear to have circular lump left side eye brow sebaceous cyst vs lipoma. Color of skin looks normal. You have appointment with dermatologist that does some cosmetic procedures based on your review of web site. ? ?Area could get infected. If any redness swelling occurs then start doxycycline antibiotic. Rx advisement given. ? ?If you don't feel comfortable seeing dermatologist could investigate how insurance covers procedure is seen by plastic. Claire Sanger Dillingham DO plastic number is [phone]. ? ?Htn- bp well controlled. Refilled bp med today. ? ?Follow up as regularly scheduled with  pcp. ? ? ?Time spent with patient today was  30 minutes which consisted of chart review, discussing differential  diagnosis,treatment and documentation.  ?

## 2021-11-07 NOTE — Patient Instructions (Addendum)
You appear to have circular lump left side eye brow sebaceous cyst vs lipoma. Color of skin looks normal. You have appointment with dermatologist that does some cosmetic procedures based on your review of web site. ? ?Area could get infected. If any redness swelling occurs then start doxycycline antibiotic. Rx advisement given. ? ?If you don't feel comfortable seeing dermatologist could investigate how insurance covers procedure is seen by plastic. Claire Sanger Dillingham DO plastic number is [phone]. ? ?Htn- bp well controlled. Refilled bp med today. ? ?Follow up as regularly scheduled with pcp. ?

## 2021-11-21 ENCOUNTER — Ambulatory Visit: Payer: Medicare Other | Admitting: Dermatology

## 2021-11-22 ENCOUNTER — Other Ambulatory Visit: Payer: Self-pay | Admitting: Family Medicine

## 2021-12-19 DIAGNOSIS — L905 Scar conditions and fibrosis of skin: Secondary | ICD-10-CM | POA: Diagnosis not present

## 2021-12-19 DIAGNOSIS — L728 Other follicular cysts of the skin and subcutaneous tissue: Secondary | ICD-10-CM | POA: Diagnosis not present

## 2021-12-19 DIAGNOSIS — Z85828 Personal history of other malignant neoplasm of skin: Secondary | ICD-10-CM | POA: Diagnosis not present

## 2021-12-19 DIAGNOSIS — Z08 Encounter for follow-up examination after completed treatment for malignant neoplasm: Secondary | ICD-10-CM | POA: Diagnosis not present

## 2021-12-29 ENCOUNTER — Ambulatory Visit: Payer: Medicare Other | Admitting: Dermatology

## 2022-02-08 ENCOUNTER — Other Ambulatory Visit (HOSPITAL_BASED_OUTPATIENT_CLINIC_OR_DEPARTMENT_OTHER): Payer: Self-pay | Admitting: Family Medicine

## 2022-02-08 DIAGNOSIS — Z1231 Encounter for screening mammogram for malignant neoplasm of breast: Secondary | ICD-10-CM

## 2022-02-14 ENCOUNTER — Ambulatory Visit (INDEPENDENT_AMBULATORY_CARE_PROVIDER_SITE_OTHER): Payer: Medicare Other | Admitting: Family Medicine

## 2022-02-14 ENCOUNTER — Encounter: Payer: Self-pay | Admitting: Family Medicine

## 2022-02-14 ENCOUNTER — Other Ambulatory Visit (INDEPENDENT_AMBULATORY_CARE_PROVIDER_SITE_OTHER): Payer: Medicare Other

## 2022-02-14 VITALS — BP 122/69 | HR 61 | Ht 67.0 in | Wt 195.0 lb

## 2022-02-14 DIAGNOSIS — I10 Essential (primary) hypertension: Secondary | ICD-10-CM

## 2022-02-14 DIAGNOSIS — E782 Mixed hyperlipidemia: Secondary | ICD-10-CM

## 2022-02-14 DIAGNOSIS — Z1231 Encounter for screening mammogram for malignant neoplasm of breast: Secondary | ICD-10-CM

## 2022-02-14 DIAGNOSIS — E559 Vitamin D deficiency, unspecified: Secondary | ICD-10-CM

## 2022-02-14 DIAGNOSIS — E079 Disorder of thyroid, unspecified: Secondary | ICD-10-CM | POA: Diagnosis not present

## 2022-02-14 MED ORDER — HYDROCHLOROTHIAZIDE 12.5 MG PO TABS: 12.5000 mg | ORAL_TABLET | Freq: Every day | ORAL | 3 refills | Status: DC

## 2022-02-14 NOTE — Assessment & Plan Note (Signed)
Previous stable. Lab orders already placed by PCP to be collected today.

## 2022-02-14 NOTE — Progress Notes (Signed)
Discuss BP med Possible keloid in belly button

## 2022-02-14 NOTE — Progress Notes (Signed)
Established Patient Office Visit  Subjective   Patient ID: Megan Shah, female    DOB: April 17, 1956  Age: 66 y.o. MRN: [id number]  CC: routine f/u, BP concerns   HPI  HYPERTENSION: - Medications: Ziac daily (ordered as BID) - Compliance: good  - Checking BP at home: yes, fluctuating - has been noticing recent lows along with low heart rates, fatigue/tiredness when this happens - wondering if she still needs the beta-blocker component (110-140s/60-80s HR dropping into the low 50s at times) -- Denies any SOB, recurrent headaches, CP, vision changes, LE edema, dizziness, palpitations, or medication side effects. - Diet: relatively healthy  - Exercise: active lifestyle - Reports she has tried losartan and valsartan in the past, but they didn't seem to work.    HYPERLIPIDEMIA - medications: lifestyle management - compliance: good - medication SEs: n/a The ASCVD Risk score (Arnett DK, et al., 2019) failed to calculate for the following reasons:   Cannot find a previous HDL lab   Cannot find a previous total cholesterol lab   Hypothyroidism Follow-up: -Taking medications (Armour Thyroid 60 mg daily x 5 days per week, 1/2 tab other 2 days per week) as prescribed in the morning, apart from other foods, meds, vitamins, etc.  -No recent changes to hair, skin, nails, energy levels   Vitamin D deficiency: - Reports taking 4,000 units BID          ROS All review of systems negative except what is listed in the HPI    Objective:     BP 122/69   Pulse 61   Ht '5\' 7"'$  (1.702 m)   Wt 195 lb (88.5 kg)   BMI 30.54 kg/m    Physical Exam Vitals reviewed.  Constitutional:      General: She is not in acute distress.    Appearance: Normal appearance. She is normal weight. She is not ill-appearing.  Cardiovascular:     Rate and Rhythm: Normal rate and regular rhythm.  Pulmonary:     Effort: Pulmonary effort is normal.     Breath sounds: Normal breath sounds.   Musculoskeletal:     Cervical back: Normal range of motion and neck supple.  Skin:    General: Skin is warm and dry.  Neurological:     General: No focal deficit present.     Mental Status: She is alert and oriented to person, place, and time. Mental status is at baseline.  Psychiatric:        Mood and Affect: Mood normal.        Behavior: Behavior normal.        Thought Content: Thought content normal.        Judgment: Judgment normal.      No results found for any visits on 02/14/22.    The ASCVD Risk score (Arnett DK, et al., 2019) failed to calculate for the following reasons:   Cannot find a previous HDL lab   Cannot find a previous total cholesterol lab    Assessment & Plan:   Problem List Items Addressed This Visit       Cardiovascular and Mediastinum   Hypertension - Primary    Changing to HCTZ to see if removing beta-blocker will improve heart rate and fatigue. Monitor at home. Already scheduled to see PCP next week and can address how she is doing on new med at that time. Continue lifestyle measures. Lab orders already placed by PCP to be collected today.  Relevant Medications   hydrochlorothiazide (HYDRODIURIL) 12.5 MG tablet     Endocrine   Thyroid disease    Previous stable. Lab orders already placed by PCP to be collected today.         Other   Hyperlipidemia    Previous stable. Lab orders already placed by PCP to be collected today. Continue lifestyle measures.      Relevant Medications   hydrochlorothiazide (HYDRODIURIL) 12.5 MG tablet   Vitamin D deficiency    Previous stable. Lab orders already placed by PCP to be collected today.       Other Visit Diagnoses     Encounter for screening mammogram for malignant neoplasm of breast     Due for screening - reports she was told that she needs to continue with diagnostic mammograms.    Relevant Orders   MM Digital Diagnostic Bilat       Return in about 3 months (around 05/17/2022) for  routine PCP f/u .    Terrilyn Saver, NP

## 2022-02-14 NOTE — Assessment & Plan Note (Addendum)
Changing to HCTZ to see if removing beta-blocker will improve heart rate and fatigue. Monitor at home. Already scheduled to see PCP next week and can address how she is doing on new med at that time. Continue lifestyle measures. Lab orders already placed by PCP to be collected today.

## 2022-02-14 NOTE — Assessment & Plan Note (Signed)
Previous stable. Lab orders already placed by PCP to be collected today. Continue lifestyle measures.

## 2022-02-15 LAB — LIPID PANEL
Cholesterol: 240 mg/dL — ABNORMAL HIGH (ref 0–200)
HDL: 62.3 mg/dL (ref >39.00)
NonHDL: 177.58
Total CHOL/HDL Ratio: 4
Triglycerides: 249 mg/dL — ABNORMAL HIGH (ref 0.0–149.0)
VLDL: 49.8 mg/dL — ABNORMAL HIGH (ref 0.0–40.0)

## 2022-02-15 LAB — COMPREHENSIVE METABOLIC PANEL
ALT: 18 U/L (ref 0–35)
AST: 19 U/L (ref 0–37)
Albumin: 4.7 g/dL (ref 3.5–5.2)
Alkaline Phosphatase: 38 U/L — ABNORMAL LOW (ref 39–117)
BUN: 17 mg/dL (ref 6–23)
CO2: 26 mEq/L (ref 19–32)
Calcium: 9.8 mg/dL (ref 8.4–10.5)
Chloride: 101 mEq/L (ref 96–112)
Creatinine, Ser: 0.66 mg/dL (ref 0.40–1.20)
GFR: 91.42 mL/min (ref >60.00)
Glucose, Bld: 103 mg/dL — ABNORMAL HIGH (ref 70–99)
Potassium: 4.1 mEq/L (ref 3.5–5.1)
Sodium: 136 mEq/L (ref 135–145)
Total Bilirubin: 0.7 mg/dL (ref 0.2–1.2)
Total Protein: 7 g/dL (ref 6.0–8.3)

## 2022-02-15 LAB — CBC
HCT: 40.3 % (ref 36.0–46.0)
Hemoglobin: 14 g/dL (ref 12.0–15.0)
MCHC: 34.8 g/dL (ref 30.0–36.0)
MCV: 88.8 fl (ref 78.0–100.0)
Platelets: 250 10*3/uL (ref 150.0–400.0)
RBC: 4.54 Mil/uL (ref 3.87–5.11)
RDW: 13.2 % (ref 11.5–15.5)
WBC: 6.3 10*3/uL (ref 4.0–10.5)

## 2022-02-15 LAB — VITAMIN D 25 HYDROXY (VIT D DEFICIENCY, FRACTURES): VITD: 89.75 ng/mL (ref 30.00–100.00)

## 2022-02-15 LAB — TSH: TSH: 0.7 u[IU]/mL (ref 0.35–5.50)

## 2022-02-15 LAB — LDL CHOLESTEROL, DIRECT: Direct LDL: 151 mg/dL

## 2022-02-21 ENCOUNTER — Telehealth (HOSPITAL_BASED_OUTPATIENT_CLINIC_OR_DEPARTMENT_OTHER): Payer: Self-pay

## 2022-02-22 NOTE — Progress Notes (Signed)
MyChart Video Visit    Virtual Visit via Video Note   This visit type was conducted due to national recommendations for restrictions regarding the COVID-19 Pandemic (e.g. social distancing) in an effort to limit this patient's exposure and mitigate transmission in our community. This patient is at least at moderate risk for complications without adequate follow up. This format is felt to be most appropriate for this patient at this time. Physical exam was limited by quality of the video and audio technology used for the visit. Verdell Carmine., CMA was able to get the patient set up on a video visit.  Patient location: home Patient and provider in visit Provider location: Office  I discussed the limitations of evaluation and management by telemedicine and the availability of in person appointments. The patient expressed understanding and agreed to proceed.  Visit Date: 02/23/2022  Today's healthcare provider: Penni Homans, MD     Subjective:    Patient ID: Megan Shah, female    DOB: 05-04-1956, 66 y.o.   MRN: [id number]  No chief complaint on file.   HPI Patient is in today for BP concerns. No recent febrile illness or acute hospitalizations. She is noting labile blood pressures last night she was 149/92. She feels the bisoprolol makes her feel too fatigued and cold. Denies palp/SOB/HA/congestion/fevers/GI or GU c/o. Taking meds as prescribed. She noted a couple of episodes of sharp chest pain while she was transitioning from Bisoprololhct to just hct but has not had another episode of pain since then.   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   Bunion of great toe of right foot 02/07/2016   Diverticulitis    History of chicken pox 06/07/2015   Hyperlipidemia    Hypertension    Left shoulder pain 12/25/2016   Overweight 06/07/2015   Palpitations 06/07/2015   Patient is Jehovah's Witness 06/07/2015   Post menopausal syndrome 06/13/2015   Preventative  health care 12/25/2016   Right hip pain 06/13/2015   Thyroid disease    Transfusion of blood product refused for religious reason 06/07/2015   Vitamin D deficiency 06/07/2015    Past Surgical History:  Procedure Laterality Date   WISDOM TOOTH EXTRACTION  Age 14    Family History  Problem Relation Age of Onset   Arthritis Mother    Hypertension Mother    Hyperlipidemia Mother    Heart disease Mother        CAD with stents, afib   Atrial fibrillation Mother        flutter   Arthritis Father        broken hip, sepsis    Cancer Father        colon cancer, mirkel cell carcinoma   Bunion Father    Dementia Father    Atrial fibrillation Father    Cancer Brother        kidney   Colon polyps Brother        non cancer polyp   Cancer Brother        lymphoma in remission   Atrial fibrillation Brother    Arthritis Maternal Grandmother    Arthritis Maternal Grandfather    Cancer Maternal Grandfather 18       colon cancer   Arthritis Paternal Grandmother    Arthritis Paternal Grandfather    Cancer Paternal Grandfather        colon cancer    Social History   Socioeconomic History   Marital status: Married  Spouse name: Not on file   Number of children: Not on file   Years of education: Not on file   Highest education level: Not on file  Occupational History   Occupation: volunteer  Tobacco Use   Smoking status: Former   Smokeless tobacco: Never   Tobacco comments:    Smoked for 2 years as a teenager.  Substance and Sexual Activity   Alcohol use: Yes    Alcohol/week: 0.0 standard drinks of alcohol   Drug use: No   Sexual activity: Yes    Comment: lives with husband, moved from [place], no dietary restrictions, minimizes wheat and dairy, volunteer work  Other Topics Concern   Not on file  Social History Narrative   Not on file   Social Determinants of Health   Financial Resource Strain: Not on file  Food Insecurity: Not on file  Transportation Needs: Not on  file  Physical Activity: Not on file  Stress: Not on file  Social Connections: Not on file  Intimate Partner Violence: Not on file    Outpatient Medications Prior to Visit  Medication Sig Dispense Refill   betamethasone dipropionate 0.05 % lotion Apply topically 2 (two) times daily. 60 mL 0   Cholecalciferol 50 MCG (2000 UT) TABS Take 4,000 Units by mouth 2 (two) times daily.      conjugated estrogens (PREMARIN) vaginal cream Place 1 Applicatorful vaginally daily. 42.5 g 12   DHEA 10 MG CAPS Take by mouth daily.     hydrochlorothiazide (HYDRODIURIL) 12.5 MG tablet Take 1 tablet (12.5 mg total) by mouth daily. 30 tablet 3   Magnesium Citrate POWD Take 615 mg by mouth 2 (two) times daily.     Multiple Vitamins-Minerals (MULTIVITAMIN ADULT PO) Take 1 tablet by mouth daily.     Omega-3 Fatty Acids (FISH OIL PO) Take by mouth.     thyroid (ARMOUR THYROID) 60 MG tablet Take 1 tablet by mouth qac 5 days weekly, take 1/2 tablet by mouth qac the other 2 days 90 tablet 1   TURMERIC PO Take by mouth.     No facility-administered medications prior to visit.    No Known Allergies  Review of Systems  Constitutional:  Positive for malaise/fatigue. Negative for fever.  HENT:  Negative for congestion.   Eyes:  Negative for blurred vision.  Respiratory:  Negative for shortness of breath.   Cardiovascular:  Positive for chest pain. Negative for palpitations and leg swelling.  Gastrointestinal:  Negative for abdominal pain, blood in stool and nausea.  Genitourinary:  Negative for dysuria and frequency.  Musculoskeletal:  Negative for falls.  Skin:  Negative for rash.  Neurological:  Negative for dizziness, loss of consciousness and headaches.  Endo/Heme/Allergies:  Negative for environmental allergies.  Psychiatric/Behavioral:  Negative for depression. The patient is not nervous/anxious.        Objective:    Physical Exam Constitutional:      General: She is not in acute distress.     Appearance: Normal appearance. She is not ill-appearing or toxic-appearing.  HENT:     Head: Normocephalic and atraumatic.     Right Ear: External ear normal.     Left Ear: External ear normal.     Nose: Nose normal.  Eyes:     General:        Right eye: No discharge.        Left eye: No discharge.  Pulmonary:     Effort: Pulmonary effort is normal.  Skin:  Findings: No rash.  Neurological:     Mental Status: She is alert and oriented to person, place, and time.  Psychiatric:        Behavior: Behavior normal.    There were no vitals taken for this visit. Wt Readings from Last 3 Encounters:  02/14/22 195 lb (88.5 kg)  10/03/21 193 lb (87.5 kg)  01/08/17 199 lb (90.3 kg)    Diabetic Foot Exam - Simple   No data filed    Lab Results  Component Value Date   WBC 6.3 02/14/2022   HGB 14.0 02/14/2022   HCT 40.3 02/14/2022   PLT 250.0 02/14/2022   GLUCOSE 103 (H) 02/14/2022   CHOL 240 (H) 02/14/2022   TRIG 249.0 (H) 02/14/2022   HDL 62.30 02/14/2022   LDLDIRECT 151.0 02/14/2022   ALT 18 02/14/2022   AST 19 02/14/2022   NA 136 02/14/2022   K 4.1 02/14/2022   CL 101 02/14/2022   CREATININE 0.66 02/14/2022   BUN 17 02/14/2022   CO2 26 02/14/2022   TSH 0.70 02/14/2022    Lab Results  Component Value Date   TSH 0.70 02/14/2022   Lab Results  Component Value Date   WBC 6.3 02/14/2022   HGB 14.0 02/14/2022   HCT 40.3 02/14/2022   MCV 88.8 02/14/2022   PLT 250.0 02/14/2022   Lab Results  Component Value Date   NA 136 02/14/2022   K 4.1 02/14/2022   CO2 26 02/14/2022   GLUCOSE 103 (H) 02/14/2022   BUN 17 02/14/2022   CREATININE 0.66 02/14/2022   BILITOT 0.7 02/14/2022   ALKPHOS 38 (L) 02/14/2022   AST 19 02/14/2022   ALT 18 02/14/2022   PROT 7.0 02/14/2022   ALBUMIN 4.7 02/14/2022   CALCIUM 9.8 02/14/2022   GFR 91.42 02/14/2022   Lab Results  Component Value Date   CHOL 240 (H) 02/14/2022   Lab Results  Component Value Date   HDL 62.30  02/14/2022   No results found for: "LDLCALC" Lab Results  Component Value Date   TRIG 249.0 (H) 02/14/2022   Lab Results  Component Value Date   CHOLHDL 4 02/14/2022   No results found for: "HGBA1C"     Assessment & Plan:   Problem List Items Addressed This Visit     Hypertension - Primary    Labile 149-115/60-92. Will switch to Benazeprilhct 5/6.25 bid as the bisoprolol seems to cause brady and fatigue. no changes to meds. Encouraged heart healthy diet such as the DASH diet and exercise as tolerated.       Relevant Medications   benazepril-hydrochlorthiazide (LOTENSIN HCT) 5-6.25 MG tablet   nitroGLYCERIN (NITROSTAT) 0.4 MG SL tablet   Hyperlipidemia    Encourage heart healthy diet such as MIND or DASH diet, increase exercise, avoid trans fats, simple carbohydrates and processed foods, consider a krill or fish or flaxseed oil cap daily.       Relevant Medications   benazepril-hydrochlorthiazide (LOTENSIN HCT) 5-6.25 MG tablet   nitroGLYCERIN (NITROSTAT) 0.4 MG SL tablet   Vitamin D deficiency    Supplement and monitor      Atypical chest pain    Patient has struggled with intermittent episodes of sharp atypical chest pain off and on for years and is worried about heart disease. Will refer to cardiology for cardiac work up and reassurance, will start 81 mg aspririn til seen and is given sl ntg to use prn and to present to ER if no resolution after 3 tabs  Relevant Orders   Ambulatory referral to Cardiology   Osteopenia    Bone density shows osteopenia, which is thinner than normal but not as bad as osteoporosis. Recommend calcium intake of 1200 to 1500 mg daily, divided into roughly 3 doses. Best source is the diet and a single dairy serving is about 500 mg, a supplement of calcium citrate once or twice daily to balance diet is fine if not getting enough in diet. Also need Vitamin D 2000 IU caps, 1 cap daily if not already taking vitamin D. Also recommend weight baring  exercise on hips and upper body to keep bones strong       I am having Fair D[place] Iha start on benazepril-hydrochlorthiazide and nitroGLYCERIN. I am also having her maintain her Cholecalciferol, Omega-3 Fatty Acids (FISH OIL PO), TURMERIC PO, Multiple Vitamins-Minerals (MULTIVITAMIN ADULT PO), DHEA, Magnesium Citrate, betamethasone dipropionate, conjugated estrogens, thyroid, and hydrochlorothiazide.  Meds ordered this encounter  Medications   benazepril-hydrochlorthiazide (LOTENSIN HCT) 5-6.25 MG tablet    Sig: Take 1 tablet by mouth in the morning and at bedtime.    Dispense:  60 tablet    Refill:  3   nitroGLYCERIN (NITROSTAT) 0.4 MG SL tablet    Sig: Place 1 tablet (0.4 mg total) under the tongue every 5 (five) minutes as needed for chest pain.    Dispense:  25 tablet    Refill:  1    I discussed the assessment and treatment plan with the patient. The patient was provided an opportunity to ask questions and all were answered. The patient agreed with the plan and demonstrated an understanding of the instructions.   The patient was advised to call back or seek an in-person evaluation if the symptoms worsen or if the condition fails to improve as anticipated.    Penni Homans, MD [facility] at [facility] [phone] (phone) [phone] (fax)  [place]

## 2022-02-23 ENCOUNTER — Encounter: Payer: Self-pay | Admitting: Family Medicine

## 2022-02-23 ENCOUNTER — Telehealth (INDEPENDENT_AMBULATORY_CARE_PROVIDER_SITE_OTHER): Payer: Medicare Other | Admitting: Family Medicine

## 2022-02-23 DIAGNOSIS — E782 Mixed hyperlipidemia: Secondary | ICD-10-CM

## 2022-02-23 DIAGNOSIS — M8580 Other specified disorders of bone density and structure, unspecified site: Secondary | ICD-10-CM | POA: Diagnosis not present

## 2022-02-23 DIAGNOSIS — R0789 Other chest pain: Secondary | ICD-10-CM

## 2022-02-23 DIAGNOSIS — E559 Vitamin D deficiency, unspecified: Secondary | ICD-10-CM

## 2022-02-23 DIAGNOSIS — I10 Essential (primary) hypertension: Secondary | ICD-10-CM

## 2022-02-23 MED ORDER — BENAZEPRIL-HYDROCHLOROTHIAZIDE 5-6.25 MG PO TABS: 1.0000 | ORAL_TABLET | Freq: Two times a day (BID) | ORAL | 3 refills | Status: DC

## 2022-02-23 MED ORDER — NITROGLYCERIN 0.4 MG SL SUBL: 0.4000 mg | SUBLINGUAL_TABLET | SUBLINGUAL | 1 refills | Status: DC | PRN

## 2022-02-23 NOTE — Assessment & Plan Note (Signed)

## 2022-02-23 NOTE — Assessment & Plan Note (Signed)
Labile 149-115/60-92. Will switch to Benazeprilhct 5/6.25 bid as the bisoprolol seems to cause brady and fatigue. no changes to meds. Encouraged heart healthy diet such as the DASH diet and exercise as tolerated.

## 2022-02-23 NOTE — Assessment & Plan Note (Signed)
Encourage heart healthy diet such as MIND or DASH diet, increase exercise, avoid trans fats, simple carbohydrates and processed foods, consider a krill or fish or flaxseed oil cap daily.  °

## 2022-02-23 NOTE — Assessment & Plan Note (Signed)
Supplement and monitor 

## 2022-02-23 NOTE — Progress Notes (Signed)
Called pt at 8:17. Went straight to VM.

## 2022-02-23 NOTE — Assessment & Plan Note (Signed)
Patient has struggled with intermittent episodes of sharp atypical chest pain off and on for years and is worried about heart disease. Will refer to cardiology for cardiac work up and reassurance, will start 81 mg aspririn til seen and is given sl ntg to use prn and to present to ER if no resolution after 3 tabs

## 2022-02-24 ENCOUNTER — Encounter: Payer: Self-pay | Admitting: Family Medicine

## 2022-02-28 ENCOUNTER — Telehealth: Payer: Self-pay | Admitting: Family Medicine

## 2022-03-01 ENCOUNTER — Other Ambulatory Visit: Payer: Self-pay

## 2022-03-01 MED ORDER — BENAZEPRIL-HYDROCHLOROTHIAZIDE 5-6.25 MG PO TABS: 1.0000 | ORAL_TABLET | Freq: Every day | ORAL | 1 refills | Status: DC

## 2022-03-01 NOTE — Telephone Encounter (Signed)
Prescription resent with signature change. Will submit quantity exception to insurance company. Pt states she doesn't have an OBGYN currently to manage the progesterone. Form faxed to [facility].

## 2022-03-05 ENCOUNTER — Encounter: Payer: Self-pay | Admitting: Emergency Medicine

## 2022-03-05 ENCOUNTER — Other Ambulatory Visit: Payer: Self-pay

## 2022-03-05 ENCOUNTER — Emergency Department
Admission: EM | Admit: 2022-03-05 | Discharge: 2022-03-05 | Disposition: A | Discharge status: Home / Self Care | Payer: Medicare Other | Attending: Emergency Medicine | Admitting: Emergency Medicine

## 2022-03-05 DIAGNOSIS — R040 Epistaxis: Secondary | ICD-10-CM | POA: Diagnosis not present

## 2022-03-05 DIAGNOSIS — Z8616 Personal history of COVID-19: Secondary | ICD-10-CM | POA: Diagnosis not present

## 2022-03-05 LAB — CBC WITH DIFFERENTIAL/PLATELET
Abs Immature Granulocytes: 0.01 10*3/uL (ref 0.00–0.07)
Basophils Absolute: 0.1 10*3/uL (ref 0.0–0.1)
Basophils Relative: 1 %
Eosinophils Absolute: 0.1 10*3/uL (ref 0.0–0.5)
Eosinophils Relative: 2 %
HCT: 40.5 % (ref 36.0–46.0)
Hemoglobin: 13.7 g/dL (ref 12.0–15.0)
Immature Granulocytes: 0 %
Lymphocytes Relative: 32 %
Lymphs Abs: 2.1 10*3/uL (ref 0.7–4.0)
MCH: 30.4 pg (ref 26.0–34.0)
MCHC: 33.8 g/dL (ref 30.0–36.0)
MCV: 89.8 fL (ref 80.0–100.0)
Monocytes Absolute: 0.6 10*3/uL (ref 0.1–1.0)
Monocytes Relative: 9 %
Neutro Abs: 3.8 10*3/uL (ref 1.7–7.7)
Neutrophils Relative %: 56 %
Platelets: 257 10*3/uL (ref 150–400)
RBC: 4.51 MIL/uL (ref 3.87–5.11)
RDW: 12.9 % (ref 11.5–15.5)
WBC: 6.8 10*3/uL (ref 4.0–10.5)
nRBC: 0 % (ref 0.0–0.2)

## 2022-03-05 LAB — PROTIME-INR
INR: 1 (ref 0.8–1.2)
Prothrombin Time: 12.9 seconds (ref 11.4–15.2)

## 2022-03-05 MED ORDER — HYDROCODONE-ACETAMINOPHEN 5-325 MG PO TABS: 1.0000 | ORAL_TABLET | ORAL | 0 refills | Status: DC | PRN

## 2022-03-05 MED ORDER — LIDOCAINE HCL URETHRAL/MUCOSAL 2 % EX GEL
1.0000 | Freq: Once | CUTANEOUS | Status: AC
  Administered 2022-03-05: 1
  Filled 2022-03-05: qty 6

## 2022-03-05 MED ORDER — OXYMETAZOLINE HCL 0.05 % NA SOLN
2.0000 | Freq: Once | NASAL | Status: DC
  Filled 2022-03-05: qty 30

## 2022-03-05 MED ORDER — AMOXICILLIN-POT CLAVULANATE 875-125 MG PO TABS: 1.0000 | ORAL_TABLET | Freq: Two times a day (BID) | ORAL | 0 refills | Status: AC

## 2022-03-05 MED ORDER — ONDANSETRON 4 MG PO TBDP: 4.0000 mg | ORAL_TABLET | Freq: Three times a day (TID) | ORAL | 0 refills | Status: DC | PRN

## 2022-03-05 NOTE — ED Provider Notes (Signed)
[facility] Provider Note    Event Date/Time   First MD Initiated Contact with Patient 03/05/22 1414     (approximate)   History   Epistaxis   HPI  [name] is a 66 y.o. female here with nosebleed.  The patient states that she has noticed what she describes as a polyp in her right nostril since she had COVID last year.  She states that she has no history of significant nosebleeds.  Earlier today, she was trying to clean/pick her nose when she began bleeding profusely from the right nostril.  She feels like she was bleeding from that area.  She states that she felt like she had something on it which is what she was trying to get to it.  She has since had profuse, red, significant bleeding from the nostril.  She feels like it runs down her throat as well.  She also recently started baby aspirin per her PCP.  No other anticoagulant use.     Physical Exam   Triage Vital Signs: ED Triage Vitals  Enc Vitals Group     BP 03/05/22 1345 (!) 184/98     Pulse Rate 03/05/22 1345 99     Resp 03/05/22 1345 20     Temp 03/05/22 1345 (!) 97.5 F (36.4 C)     Temp Source 03/05/22 1345 Oral     SpO2 03/05/22 1345 99 %     Weight 03/05/22 1345 195 lb (88.5 kg)     Height 03/05/22 1345 '5\' 6"'$  (1.676 m)     Head Circumference --      Peak Flow --      Pain Score 03/05/22 1351 0     Pain Loc --      Pain Edu? --      Excl. in [place]? --     Most recent vital signs: Vitals:   03/05/22 1345  BP: (!) 184/98  Pulse: 99  Resp: 20  Temp: (!) 97.5 F (36.4 C)  SpO2: 99%     General: Awake, in moderate distress due to active nosebleed. CV:  Good peripheral perfusion.  Resp:  Normal effort.  Abd:  No distention.  Other:  Significant bright red bleeding from the right nares.  Intermittently coughing up small blood clots.     ED Results / Procedures / Treatments   Labs (all labs ordered are listed, but only abnormal results are displayed) Labs Reviewed   CBC WITH DIFFERENTIAL/PLATELET  PROTIME-INR     EKG    RADIOLOGY    I also independently reviewed and agree with radiologist interpretations.   PROCEDURES:  Critical Care performed: No  .Epistaxis Management  Date/Time: 03/05/2022 4:53 PM  Performed by: Duffy Bruce, MD Authorized by: Duffy Bruce, MD   Consent:    Consent obtained:  Verbal   Consent given by:  Patient   Risks, benefits, and alternatives were discussed: yes     Risks discussed:  Bleeding, infection, nasal injury and pain   Alternatives discussed:  No treatment and alternative treatment Universal protocol:    Immediately prior to procedure, a time out was called: yes   Anesthesia:    Anesthesia method:  Topical application   Topical anesthetic:  Lidocaine gel Procedure details:    Treatment site:  R anterior   Treatment method:  Anterior pack   Treatment complexity:  Limited   Treatment episode: initial   Post-procedure details:    Assessment:  Bleeding stopped  Procedure completion:  Tolerated     MEDICATIONS ORDERED IN ED: Medications  oxymetazoline (AFRIN) 0.05 % nasal spray 2 spray (2 sprays Each Nare Not Given 03/05/22 1508)  lidocaine (XYLOCAINE) 2 % jelly 1 Application (1 Application Other Given by Other 03/05/22 1428)     IMPRESSION / MDM / ASSESSMENT AND PLAN / ED COURSE  I reviewed the triage vital signs and the nursing notes.                               The patient is on the cardiac monitor to evaluate for evidence of arrhythmia and/or significant heart rate changes.   Ddx:  Differential includes the following, with pertinent life- or limb-threatening emergencies considered:  Anterior epistaxis, posterior epistaxis, nasal mass/lesion, bleeding diathesis, possible hypertensive emergency  Patient's presentation is most consistent with acute presentation with potential threat to life or bodily function.  MDM:  66 year old female here with significant right anterior  epistaxis.  Suspect this is due to what sounds like a possible nasal polyp versus granuloma related to previous infection.  No smoking history or significant risk factors for cancer.  It is very difficult to visualize given the degree of bleeding, even after pressure and Afrin.  Patient had persistent, severe bleeding after topical measures, so anterior 5.5 cm Rhino Rocket applied.  Patient had mild discomfort during the placement, but feels better after placement and has had no recurrent bleeding.  Her hemoglobin is stable.  She is a Restaurant manager, fast food.  INR is normal.  Patient monitored for several hours and has no signs of recurrent bleeding or airway compromise.  Vital signs are stable.  Will discharge with outpatient ENT follow-up and prophylactic antibiotics.  Brief course of analgesia given as well.  Return precautions given.   MEDICATIONS GIVEN IN ED: Medications  oxymetazoline (AFRIN) 0.05 % nasal spray 2 spray (2 sprays Each Nare Not Given 03/05/22 1508)  lidocaine (XYLOCAINE) 2 % jelly 1 Application (1 Application Other Given by Other 03/05/22 1428)     Consults:     EMR reviewed       FINAL CLINICAL IMPRESSION(S) / ED DIAGNOSES   Final diagnoses:  Epistaxis     Rx / DC Orders   ED Discharge Orders          Ordered    HYDROcodone-acetaminophen (NORCO/VICODIN) 5-325 MG tablet  Every 4 hours PRN        03/05/22 1607    ondansetron (ZOFRAN-ODT) 4 MG disintegrating tablet  Every 8 hours PRN        03/05/22 1607    amoxicillin-clavulanate (AUGMENTIN) 875-125 MG tablet  2 times daily        03/05/22 1607             Note:  This document was prepared using Dragon voice recognition software and may include unintentional dictation errors.   Duffy Bruce, MD 03/05/22 1655

## 2022-03-05 NOTE — ED Triage Notes (Signed)
Pt via POV from home. Pt c/o epistaxis for the past 30 mins. Denies any blood thinners other than a baby ASA , pt does have HTN. Pt is A&OX4 and NAD

## 2022-03-05 NOTE — Discharge Instructions (Signed)

## 2022-03-07 ENCOUNTER — Other Ambulatory Visit: Payer: Self-pay

## 2022-03-07 ENCOUNTER — Emergency Department
Admission: EM | Admit: 2022-03-07 | Discharge: 2022-03-07 | Disposition: A | Discharge status: Home / Self Care | Payer: Medicare Other | Attending: Emergency Medicine | Admitting: Emergency Medicine

## 2022-03-07 DIAGNOSIS — J3489 Other specified disorders of nose and nasal sinuses: Secondary | ICD-10-CM | POA: Diagnosis not present

## 2022-03-07 DIAGNOSIS — Z760 Encounter for issue of repeat prescription: Secondary | ICD-10-CM | POA: Insufficient documentation

## 2022-03-07 NOTE — ED Notes (Signed)
First Nurse Note: Pt to ED via POV, pt is needing a refill on her pain medication until she can get into her doctor to be seen.

## 2022-03-07 NOTE — ED Notes (Signed)
Pt off unit with verbal DC info, did not wait for DC ppw or nurse review.

## 2022-03-07 NOTE — ED Provider Notes (Signed)
[facility] Provider Note    Event Date/Time   First MD Initiated Contact with Patient 03/07/22 1032     (approximate)   History   Medication Refill   HPI  Megan Shah is a 66 y.o. female  who presents to the emergency department today because of concern for running low on the pain medication that was prescribed to her after having her nose packed 2 days ago.  She states she only has 3 more pills left and she is worried about hot she is going to get through the night.  She states she has been using the pain medication every roughly 4 to 5 hours.   Physical Exam   Triage Vital Signs: ED Triage Vitals  Enc Vitals Group     BP 03/07/22 0957 (!) 155/96     Pulse Rate 03/07/22 0957 87     Resp 03/07/22 0957 18     Temp 03/07/22 0957 97.6 F (36.4 C)     Temp Source 03/07/22 0957 Oral     SpO2 03/07/22 0957 95 %     Weight 03/07/22 0958 194 lb 0.1 oz (88 kg)     Height 03/07/22 0958 '5\' 6"'$  (1.676 m)     Head Circumference --      Peak Flow --      Pain Score 03/07/22 0958 7     Pain Loc --      Pain Edu? --      Excl. in [place]? --     Most recent vital signs: Vitals:   03/07/22 0957  BP: (!) 155/96  Pulse: 87  Resp: 18  Temp: 97.6 F (36.4 C)  SpO2: 95%    General: Awake, alert, oriented. CV:  Good peripheral perfusion.  Resp:  Normal effort.  ENT:  Packing to right nares.   ED Results / Procedures / Treatments   Labs (all labs ordered are listed, but only abnormal results are displayed) Labs Reviewed - No data to display   EKG  None   RADIOLOGY None  PROCEDURES:  Critical Care performed: No  Procedures   MEDICATIONS ORDERED IN ED: Medications - No data to display   IMPRESSION / MDM / [place] / ED COURSE  I reviewed the triage vital signs and the nursing notes.                              Patient presents to the emergency department today because of concern that she is running out of the  narcotic pain medication that was prescribed to her after her right nares had to be packed two days ago. On exam packing is still in place. Patient was requesting refill of her narcotic pain prescription. I did state that we do not refill narcotic prescriptions and that typically we do not give narcotics for nasal packing. Patient stated that we have to refill medication prescriptions.  I discussed that we do not have to, and that I would be happy to try different pain modalities as well as offering to either remove the packing or deflate it. Patient was willing to try deflating the packing, which was performed. Patient did feel some relief. Patient was given a syringe in case she starts to rebleed so she can refill the packing if needed. Patient left the emergency department prior to discharge paperwork being completed.   FINAL CLINICAL IMPRESSION(S) / ED DIAGNOSES   Final  diagnoses:  Encounter for medication refill  Nose pain     Note:  This document was prepared using Dragon voice recognition software and may include unintentional dictation errors.    Nance Pear, MD 03/07/22 [phone]

## 2022-03-07 NOTE — ED Triage Notes (Signed)
Pt to ED for medication refill, states was given hydrocodone on 7/2 and needs some more d.t having procedure for prolonged epistaxis. Has not been able to follow up with ENT d/t holiday

## 2022-03-08 ENCOUNTER — Telehealth: Payer: Self-pay

## 2022-03-08 NOTE — Telephone Encounter (Signed)
Spoke with pt. Pt states the ED provider refused to provide narcotics. Based on ED note, pt was offered non-narcotic alternatives. Pt declined and eloped.   Pt states she will try to get in with an ENT.

## 2022-03-08 NOTE — Telephone Encounter (Signed)
Patient Name: Megan Shah [facility] Gender: Female DOB: August 16, 1956 Age: 66 Y 29 M 18 D Return Phone Number: [phone] Client [facility] Night - Client Client Site [facility] - Night Provider Penni Homans - MD Contact Type Call Who Is Calling Patient / Member / Family / Caregiver Call Type Triage / Clinical Relationship To Patient Self Return Phone Number [phone] (Primary) Chief Complaint Prescription Refill or Medication Request (non symptomatic) Reason for Call Symptomatic / Request for Health Information Initial Comment Caller states she was seen in the ER Sunday for a 2hr nose bleed, she has packing in her nose and was given script for hydrocodone with acetaminophen which she only has 1 left. Caller asking for emergency script for pain medication until she can be seen by her doctor Translation No Nurse Assessment Nurse: Velta Addison, RN, [place] Date/Time (Eastern Time): 03/07/2022 7:22:05 AM Confirm and document reason for call. If symptomatic, describe symptoms. ---Caller states she was seen in the ER Sunday for a 2hr nose bleed, she has packing in her nose and was given script for hydrocodone with acetaminophen which she only has 3 left. Caller asking for emergency script for pain medication until she can be seen by her doctor. States she called yesterday, but office closed. ER thinks it is a polyp Does the patient have any new or worsening symptoms? ---Yes Will a triage be completed? ---Yes Related visit to physician within the last 2 weeks? ---Yes Does the PT have any chronic conditions? (i.e. diabetes, asthma, this includes High risk factors for pregnancy, etc.)

## 2022-03-09 ENCOUNTER — Ambulatory Visit (INDEPENDENT_AMBULATORY_CARE_PROVIDER_SITE_OTHER): Payer: Medicare Other | Admitting: Family Medicine

## 2022-03-09 ENCOUNTER — Encounter: Payer: Self-pay | Admitting: Family Medicine

## 2022-03-09 VITALS — BP 132/68 | HR 79 | Ht 66.0 in | Wt 195.8 lb

## 2022-03-09 DIAGNOSIS — R040 Epistaxis: Secondary | ICD-10-CM

## 2022-03-09 NOTE — Progress Notes (Signed)
   Established Patient Office Visit  Subjective   Patient ID: Megan Shah, female    DOB: Dec 12, 1955  Age: 66 y.o. MRN: [id number]  CC: nosebleed   HPI  Patient states she is here to have Rhino Rocket removed. Reports she talked to someone in our office who told her I would remove it. However, she spoke with ENT and they have a 5 day policy before removal - she already has an appointment to see them tomorrow (day 5). She is here today to see if I will remove it earlier for her.   Discussed with PCP and we do not typically do that here, especially given that she initially bled for 2 hours and we would not have a good way of stopping the bleed if it were to restart. Offered to remove a little bit of air to relieve some pressure, but prefer that she keep her ENT appointment tomorrow to fully remove and evaluate.   Patient is disappointed because she drove all the way from [facility] thinking we would remove it early for her.       Objective:     BP 132/68   Pulse 79   Ht '5\' 6"'$  (1.676 m)   Wt 195 lb 12.8 oz (88.8 kg)   BMI 31.60 kg/m    Physical Exam No bleeding, no signs of infection.     Assessment & Plan:   Given excessive bleeding episode, recommend she keep with ENT recommendation of waiting the full 5 days and follow-up with them tomorrow for removal.  No charge today given she thought she was getting it removed today and we advised waiting until ENT appointment (tomorrow).  Discussed with PCP.     Terrilyn Saver, NP

## 2022-03-10 ENCOUNTER — Encounter: Payer: Self-pay | Admitting: Family Medicine

## 2022-03-10 ENCOUNTER — Telehealth: Payer: Self-pay | Admitting: *Deleted

## 2022-03-10 DIAGNOSIS — R040 Epistaxis: Secondary | ICD-10-CM

## 2022-03-10 NOTE — Telephone Encounter (Signed)
Referral placed to ENT

## 2022-03-17 ENCOUNTER — Encounter: Payer: Self-pay | Admitting: *Deleted

## 2022-03-31 ENCOUNTER — Encounter: Payer: Self-pay | Admitting: Family Medicine

## 2022-03-31 ENCOUNTER — Other Ambulatory Visit: Payer: Self-pay | Admitting: Family Medicine

## 2022-03-31 MED ORDER — LOSARTAN POTASSIUM 25 MG PO TABS: 25.0000 mg | ORAL_TABLET | Freq: Every day | ORAL | 1 refills | Status: DC

## 2022-04-03 ENCOUNTER — Encounter: Payer: Self-pay | Admitting: Family Medicine

## 2022-04-03 NOTE — Progress Notes (Unsigned)
Cardiology Office Note:   Date:  04/04/2022  NAME:  Megan Shah    MRN: [id number] DOB:  25-Nov-1955   PCP:  Mosie Lukes, MD  Cardiologist:  None  Electrophysiologist:  None   Referring MD: Mosie Lukes, MD   Chief Complaint  Patient presents with   Chest Pain   History of Present Illness:   Megan Shah is a 66 y.o. female with a hx of HTN, HLD who is being seen today for the evaluation of chest pain at the request of Mosie Lukes, MD. she presents with a constellation of symptoms including sharp chest tightness.  Symptoms have occurred for a few months.  She recently relocated back to [place] from [place].  Apparently her husband had strokes and they had to [place] lower altitude.  She reports intermittent tightness sharpness in her chest.  Symptoms can occur at any time.  Not exertional and alleviated by rest.  Can occur most days.  She also reports elevation in her blood pressure.  She is now on losartan 50 mg daily and blood pressure has improved.  She has noticed a lot of salt in the water she drinks and this could be contributing.  She is going to work on that.  She also reports increase in her heart rate.  She apparently took bisoprolol for several years.  She stopped taking this 1 month ago.  She informed me that she has a Apple watch.  She has not noticed any atrial fibrillation.  She does take thyroid supplementation.  Cholesterol level elevated as below.  Her EKG shows nonspecific ST-T changes.  She has never had a heart attack or stroke.  There is a family history of A-fib and other heart issues.  She is a never smoker.  She drinks alcohol in moderation.  No drug use.  She is retired.  They owned a Armed forces operational officer.  She does report no children.  Apparently this has been a big change given her husband strokes.  Apparently he was quite active.  She is quite concerned about her chest symptoms.  She is concerned she may have a heart attack in her  life.  Problem List HTN HLD -T chol 240, HDL 62, LDL 151, TG 249  Past Medical History: 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   Bunion of great toe of right foot 02/07/2016   Diverticulitis    History of chicken pox 06/07/2015   Hyperlipidemia    Hypertension    Left shoulder pain 12/25/2016   Overweight 06/07/2015   Palpitations 06/07/2015   Patient is Jehovah's Witness 06/07/2015   Post menopausal syndrome 06/13/2015   Preventative health care 12/25/2016   Right hip pain 06/13/2015   Thyroid disease    Transfusion of blood product refused for religious reason 06/07/2015   Vitamin D deficiency 06/07/2015    Past Surgical History: Past Surgical History:  Procedure Laterality Date   PARTIAL HIP ARTHROPLASTY     WISDOM TOOTH EXTRACTION  Age 22    Current Medications: Current Meds  Medication Sig   betamethasone dipropionate 0.05 % lotion Apply topically 2 (two) times daily.   Cholecalciferol 50 MCG (2000 UT) TABS Take 4,000 Units by mouth 2 (two) times daily.    conjugated estrogens (PREMARIN) vaginal cream Place 1 Applicatorful vaginally daily.   DHEA 10 MG CAPS Take by mouth daily.   Magnesium Citrate POWD Take 615 mg by mouth 2 (two) times daily.  metoprolol tartrate (LOPRESSOR) 100 MG tablet Take 1 tablet by mouth once for procedure.   Multiple Vitamins-Minerals (MULTIVITAMIN ADULT PO) Take 1 tablet by mouth daily.   nitroGLYCERIN (NITROSTAT) 0.4 MG SL tablet Place 1 tablet (0.4 mg total) under the tongue every 5 (five) minutes as needed for chest pain.   Omega-3 Fatty Acids (FISH OIL PO) Take by mouth.   ondansetron (ZOFRAN-ODT) 4 MG disintegrating tablet Take 1 tablet (4 mg total) by mouth every 8 (eight) hours as needed for nausea or vomiting.   thyroid (ARMOUR THYROID) 60 MG tablet Take 1 tablet by mouth qac 5 days weekly, take 1/2 tablet by mouth qac the other 2 days   TURMERIC PO Take by mouth.   [DISCONTINUED] losartan (COZAAR) 25 MG  tablet Take 1 tablet (25 mg total) by mouth daily.     Allergies:    Patient has no known allergies.   Social History: Social History   Socioeconomic History   Marital status: Married    Spouse name: Not on file   Number of children: 0   Years of education: Not on file   Highest education level: Not on file  Occupational History   Occupation: Psychologist, occupational   Occupation: Retired Education administrator business  Tobacco Use   Smoking status: Former   Smokeless tobacco: Never   Tobacco comments:    Smoked for 2 years as a teenager.  Vaping Use   Vaping Use: Never used  Substance and Sexual Activity   Alcohol use: Yes    Alcohol/week: 0.0 standard drinks of alcohol   Drug use: No   Sexual activity: Yes    Comment: lives with husband, moved from [place], no dietary restrictions, minimizes wheat and dairy, volunteer work  Other Topics Concern   Not on file  Social History Narrative   Not on file   Social Determinants of Health   Financial Resource Strain: Not on file  Food Insecurity: Not on file  Transportation Needs: Not on file  Physical Activity: Not on file  Stress: Not on file  Social Connections: Not on file     Family History: The patient's family history includes Arrhythmia in her father and mother; Arthritis in her father, maternal grandfather, maternal grandmother, mother, paternal grandfather, and paternal grandmother; Atrial fibrillation in her brother, father, and mother; Bunion in her father; Cancer in her brother, brother, father, and paternal grandfather; Cancer (age of onset: 50) in her maternal grandfather; Colon polyps in her brother; Dementia in her father; Heart disease in her mother; Hyperlipidemia in her mother; Hypertension in her mother.  ROS:   All other ROS reviewed and negative. Pertinent positives noted in the HPI.     EKGs/Labs/Other Studies Reviewed:   The following studies were personally reviewed by me today:  EKG:  EKG is ordered today.  The ekg  ordered today demonstrates normal sinus rhythm heart rate 80, no acute ischemic changes or evidence of infarction, and was personally reviewed by me.   Recent Labs: 02/14/2022: ALT 18; BUN 17; Creatinine, Ser 0.66; Potassium 4.1; Sodium 136; TSH 0.70 03/05/2022: Hemoglobin 13.7; Platelets 257   Recent Lipid Panel    Component Value Date/Time   CHOL 240 (H) 02/14/2022 1423   TRIG 249.0 (H) 02/14/2022 1423   HDL 62.30 02/14/2022 1423   CHOLHDL 4 02/14/2022 1423   VLDL 49.8 (H) 02/14/2022 1423   LDLDIRECT 151.0 02/14/2022 1423    Physical Exam:   VS:  BP 132/78   Pulse 80   Ht  $'5\' 6"'U$  (1.676 m)   Wt 193 lb 11.2 oz (87.9 kg)   SpO2 98%   BMI 31.26 kg/m    Wt Readings from Last 3 Encounters:  04/04/22 193 lb 11.2 oz (87.9 kg)  03/09/22 195 lb 12.8 oz (88.8 kg)  03/07/22 194 lb 0.1 oz (88 kg)    General: Well nourished, well developed, in no acute distress Head: Atraumatic, normal size  Eyes: PEERLA, EOMI  Neck: Supple, no JVD Endocrine: No thryomegaly Cardiac: Normal S1, S2; RRR; no murmurs, rubs, or gallops Lungs: Clear to auscultation bilaterally, no wheezing, rhonchi or rales  Abd: Soft, nontender, no hepatomegaly  Ext: No edema, pulses 2+ Musculoskeletal: No deformities, BUE and BLE strength normal and equal Skin: Warm and dry, no rashes   Neuro: Alert and oriented to person, place, time, and situation, CNII-XII grossly intact, no focal deficits  Psych: Normal mood and affect   ASSESSMENT:   Megan Amarys Sliwinski is a 66 y.o. female who presents for the following: 1. Precordial pain   2. Mixed hyperlipidemia   3. Primary hypertension   4. Palpitations     PLAN:   1. Precordial pain 2. Mixed hyperlipidemia -She presents with intermittent sharp chest discomfort.  Symptoms are not exertional.  Not alleviated by rest.  CV risk factors include obesity and physical inactivity.  She also has elevated cholesterol level.  She does have a family history of heart disease.  Her EKG  is unremarkable.  Symptoms could represent cardiac pathology.  We discussed coronary CTA for further evaluation.  We will need a BMP as well as 100 mg metoprolol tartrate 2 hours before the scan.  She is quite concerned she may have a heart attack.  We will check this out with coronary CTA.  Follow-up as needed based on the results of the scan.  3. Primary hypertension -Blood pressure is elevated but has been much improved on losartan 50 mg daily.  We discussed salt reduction as well as regular exercise.  She also snores and may have sleep apnea.  She should evaluate this further with her primary care physician.  This could be contributing.  In the meantime I recommended regular exercise and weight loss as this will help.  She should also adhere to a low-salt diet.  4. Palpitations -Reports elevation in heart rate.  She has an Apple watch.  No arrhythmias.  I would recommend to continue monitoring this.  Symptoms could just be stress related.  No arrhythmias have been captured.  Disposition: Return if symptoms worsen or fail to improve.  Medication Adjustments/Labs and Tests Ordered: Current medicines are reviewed at length with the patient today.  Concerns regarding medicines are outlined above.  Orders Placed This Encounter  Procedures   CT CORONARY MORPH W/CTA COR W/SCORE W/CA W/CM &/OR WO/CM   Basic metabolic panel   EKG [id number]   Meds ordered this encounter  Medications   metoprolol tartrate (LOPRESSOR) 100 MG tablet    Sig: Take 1 tablet by mouth once for procedure.    Dispense:  1 tablet    Refill:  0   losartan (COZAAR) 50 MG tablet    Sig: Take 1 tablet (50 mg total) by mouth daily.    Dispense:  90 tablet    Refill:  3    Patient Instructions  Medication Instructions:  Take Metoprolol 100 mg two hours before CT when scheduled.   TAKE Losartan 50 mg daily   *If you need a refill on your  cardiac medications before your next appointment, please call your pharmacy*   Lab  Work: BMET today   If you have labs (blood work) drawn today and your tests are completely normal, you will receive your results only by: [place] (if you have MyChart) OR A paper copy in the mail If you have any lab test that is abnormal or we need to change your treatment, we will call you to review the results.   Testing/Procedures: Your physician has requested that you have cardiac CT. Cardiac computed tomography (CT) is a painless test that uses an x-ray machine to take clear, detailed pictures of your heart. For further information please visit HugeFiesta.[email or web address]. Please follow instruction sheet as given.   Follow-Up: At [facility], you and your health needs are our priority.  As part of our continuing mission to provide you with exceptional heart care, we have created designated Provider Care Teams.  These Care Teams include your primary Cardiologist (physician) and Advanced Practice Providers (APPs -  Physician Assistants and Nurse Practitioners) who all work together to provide you with the care you need, when you need it.  We recommend signing up for the patient portal called "MyChart".  Sign up information is provided on this After Visit Summary.  MyChart is used to connect with patients for Virtual Visits (Telemedicine).  Patients are able to view lab/test results, encounter notes, upcoming appointments, etc.  Non-urgent messages can be sent to your provider as well.   To learn more about what you can do with MyChart, go to [email or web address].    Your next appointment:   As needed  The format for your next appointment:   In Person  Provider:   Eleonore Chiquito, MD    Other Instructions   Your cardiac CT will be scheduled at one of the below locations:   [facility] [address] [place],  [address] [phone]  If scheduled at [facility], please arrive at the [facility] and Children's Entrance (Entrance C2) of [facility] 30 minutes prior to test start time. You can use the FREE valet parking offered at entrance C (encouraged to control the heart rate for the test)  Proceed to the [facility] Radiology Department (first floor) to check-in and test prep.  All radiology patients and guests should use entrance C2 at [facility], accessed from [facility], even though the hospital's physical address listed is [address]      Please follow these instructions carefully (unless otherwise directed):   On the Night Before the Test: Be sure to Drink plenty of water. Do not consume any caffeinated/decaffeinated beverages or chocolate 12 hours prior to your test. Do not take any antihistamines 12 hours prior to your test.  On the Day of the Test: Drink plenty of water until 1 hour prior to the test. Do not eat any food 4 hours prior to the test. You may take your regular medications prior to the test.  Take metoprolol (Lopressor) two hours prior to test. HOLD Furosemide/Hydrochlorothiazide morning of the test. FEMALES- please wear underwire-free bra if available, avoid dresses & tight clothing      After the Test: Drink plenty of water. After receiving IV contrast, you may experience a mild flushed feeling. This is normal. On occasion, you may experience a mild rash up to 24 hours after the test. This is not dangerous. If this occurs, you can take Benadryl 25 mg and increase your fluid  intake. If you experience trouble breathing, this can be serious. If it is severe call 911 IMMEDIATELY. If it is mild, please call our office. If you take any of these medications: Glipizide/Metformin, Avandament, Glucavance, please do not take 48 hours after completing test unless otherwise instructed.  We will call to schedule your test 2-4 weeks out understanding that some insurance companies will need an authorization prior to the service being performed.   For non-scheduling related  questions, please contact the cardiac imaging nurse navigator should you have any questions/concerns: Marchia Bond, Cardiac Imaging Nurse Navigator Gordy Clement, Cardiac Imaging Nurse Navigator [facility] Direct Office Dial: [phone]   For scheduling needs, including cancellations and rescheduling, please call [place], [phone].            Signed, Addison Naegeli. Audie Box, MD, [place]  [address], [place], [place] [address] [phone]  04/04/2022 3:05 PM

## 2022-04-04 ENCOUNTER — Encounter: Payer: Self-pay | Admitting: Cardiovascular Disease

## 2022-04-04 ENCOUNTER — Ambulatory Visit: Payer: Medicare Other | Admitting: Cardiovascular Disease

## 2022-04-04 ENCOUNTER — Encounter: Payer: Medicare Other | Admitting: Family Medicine

## 2022-04-04 VITALS — BP 132/78 | HR 80 | Ht 66.0 in | Wt 193.7 lb

## 2022-04-04 DIAGNOSIS — R002 Palpitations: Secondary | ICD-10-CM | POA: Diagnosis not present

## 2022-04-04 DIAGNOSIS — R072 Precordial pain: Secondary | ICD-10-CM

## 2022-04-04 DIAGNOSIS — I10 Essential (primary) hypertension: Secondary | ICD-10-CM | POA: Diagnosis not present

## 2022-04-04 DIAGNOSIS — E782 Mixed hyperlipidemia: Secondary | ICD-10-CM

## 2022-04-04 MED ORDER — LOSARTAN POTASSIUM 50 MG PO TABS: 50.0000 mg | ORAL_TABLET | Freq: Every day | ORAL | 3 refills | Status: DC

## 2022-04-04 MED ORDER — METOPROLOL TARTRATE 100 MG PO TABS: ORAL_TABLET | ORAL | 0 refills | Status: DC

## 2022-04-04 NOTE — Patient Instructions (Addendum)
Medication Instructions:  Take Metoprolol 100 mg two hours before CT when scheduled.   TAKE Losartan 50 mg daily   *If you need a refill on your cardiac medications before your next appointment, please call your pharmacy*   Lab Work: BMET today   If you have labs (blood work) drawn today and your tests are completely normal, you will receive your results only by: [place] (if you have MyChart) OR A paper copy in the mail If you have any lab test that is abnormal or we need to change your treatment, we will call you to review the results.   Testing/Procedures: Your physician has requested that you have cardiac CT. Cardiac computed tomography (CT) is a painless test that uses an x-ray machine to take clear, detailed pictures of your heart. For further information please visit HugeFiesta.tn. Please follow instruction sheet as given.   Follow-Up: At [facility], you and your health needs are our priority.  As part of our continuing mission to provide you with exceptional heart care, we have created designated Provider Care Teams.  These Care Teams include your primary Cardiologist (physician) and Advanced Practice Providers (APPs -  Physician Assistants and Nurse Practitioners) who all work together to provide you with the care you need, when you need it.  We recommend signing up for the patient portal called "MyChart".  Sign up information is provided on this After Visit Summary.  MyChart is used to connect with patients for Virtual Visits (Telemedicine).  Patients are able to view lab/test results, encounter notes, upcoming appointments, etc.  Non-urgent messages can be sent to your provider as well.   To learn more about what you can do with MyChart, go to [email or web address].    Your next appointment:   As needed  The format for your next appointment:   In Person  Provider:   Eleonore Chiquito, MD    Other Instructions   Your cardiac CT will be scheduled at  one of the below locations:   [facility] [address] [place], [place] [address] [phone]  If scheduled at [facility], please arrive at the [facility] and Children's Entrance (Entrance C2) of [facility] 30 minutes prior to test start time. You can use the FREE valet parking offered at entrance C (encouraged to control the heart rate for the test)  Proceed to the [facility] Radiology Department (first floor) to check-in and test prep.  All radiology patients and guests should use entrance C2 at [facility], accessed from [facility], even though the hospital's physical address listed is [address]      Please follow these instructions carefully (unless otherwise directed):   On the Night Before the Test: Be sure to Drink plenty of water. Do not consume any caffeinated/decaffeinated beverages or chocolate 12 hours prior to your test. Do not take any antihistamines 12 hours prior to your test.  On the Day of the Test: Drink plenty of water until 1 hour prior to the test. Do not eat any food 4 hours prior to the test. You may take your regular medications prior to the test.  Take metoprolol (Lopressor) two hours prior to test. HOLD Furosemide/Hydrochlorothiazide morning of the test. FEMALES- please wear underwire-free bra if available, avoid dresses & tight clothing      After the Test: Drink plenty of water. After receiving IV contrast, you may experience a mild flushed feeling. This is normal. On occasion, you may  experience a mild rash up to 24 hours after the test. This is not dangerous. If this occurs, you can take Benadryl 25 mg and increase your fluid intake. If you experience trouble breathing, this can be serious. If it is severe call 911 IMMEDIATELY. If it is mild, please call our office. If you take any of these medications: Glipizide/Metformin, Avandament, Glucavance, please do not take 48 hours after  completing test unless otherwise instructed.  We will call to schedule your test 2-4 weeks out understanding that some insurance companies will need an authorization prior to the service being performed.   For non-scheduling related questions, please contact the cardiac imaging nurse navigator should you have any questions/concerns: Marchia Bond, Cardiac Imaging Nurse Navigator Gordy Clement, Cardiac Imaging Nurse Navigator [facility] Direct Office Dial: [phone]   For scheduling needs, including cancellations and rescheduling, please call [place], [phone].

## 2022-04-05 LAB — BASIC METABOLIC PANEL
BUN/Creatinine Ratio: 14 (ref 12–28)
BUN: 10 mg/dL (ref 8–27)
CO2: 20 mmol/L (ref 20–29)
Calcium: 9.5 mg/dL (ref 8.7–10.3)
Chloride: 102 mmol/L (ref 96–106)
Creatinine, Ser: 0.74 mg/dL (ref 0.57–1.00)
Glucose: 124 mg/dL — ABNORMAL HIGH (ref 70–99)
Potassium: 4.2 mmol/L (ref 3.5–5.2)
Sodium: 137 mmol/L (ref 134–144)
eGFR: 89 mL/min/{1.73_m2} (ref >59)

## 2022-04-11 ENCOUNTER — Ambulatory Visit
Admission: RE | Admit: 2022-04-11 | Discharge: 2022-04-11 | Disposition: A | Discharge status: Home / Self Care | Payer: Medicare Other | Source: Ambulatory Visit | Attending: Family Medicine | Admitting: Family Medicine

## 2022-04-11 DIAGNOSIS — Z1231 Encounter for screening mammogram for malignant neoplasm of breast: Secondary | ICD-10-CM

## 2022-04-14 ENCOUNTER — Other Ambulatory Visit: Payer: Self-pay | Admitting: Family Medicine

## 2022-04-14 DIAGNOSIS — Z1231 Encounter for screening mammogram for malignant neoplasm of breast: Secondary | ICD-10-CM

## 2022-04-20 ENCOUNTER — Ambulatory Visit: Payer: Medicare Other

## 2022-04-24 ENCOUNTER — Ambulatory Visit (INDEPENDENT_AMBULATORY_CARE_PROVIDER_SITE_OTHER): Payer: Medicare Other | Admitting: Family Medicine

## 2022-04-24 VITALS — BP 117/66 | HR 88 | Temp 97.7°F | Resp 16 | Ht 66.0 in | Wt 190.0 lb

## 2022-04-24 DIAGNOSIS — R739 Hyperglycemia, unspecified: Secondary | ICD-10-CM

## 2022-04-24 DIAGNOSIS — R002 Palpitations: Secondary | ICD-10-CM | POA: Diagnosis not present

## 2022-04-24 DIAGNOSIS — Z0000 Encounter for general adult medical examination without abnormal findings: Secondary | ICD-10-CM | POA: Diagnosis not present

## 2022-04-24 DIAGNOSIS — E079 Disorder of thyroid, unspecified: Secondary | ICD-10-CM

## 2022-04-24 DIAGNOSIS — M8580 Other specified disorders of bone density and structure, unspecified site: Secondary | ICD-10-CM

## 2022-04-24 DIAGNOSIS — I10 Essential (primary) hypertension: Secondary | ICD-10-CM

## 2022-04-24 DIAGNOSIS — L578 Other skin changes due to chronic exposure to nonionizing radiation: Secondary | ICD-10-CM | POA: Diagnosis not present

## 2022-04-24 DIAGNOSIS — E782 Mixed hyperlipidemia: Secondary | ICD-10-CM

## 2022-04-24 DIAGNOSIS — E559 Vitamin D deficiency, unspecified: Secondary | ICD-10-CM

## 2022-04-24 DIAGNOSIS — L989 Disorder of the skin and subcutaneous tissue, unspecified: Secondary | ICD-10-CM

## 2022-04-24 NOTE — Assessment & Plan Note (Deleted)
Encourage heart healthy diet such as MIND or DASH diet, increase exercise, avoid trans fats, simple carbohydrates and processed foods, consider a krill or fish or flaxseed oil cap daily.  °

## 2022-04-24 NOTE — Assessment & Plan Note (Signed)
Well controlled, no changes to meds. Encouraged heart healthy diet such as the DASH diet and exercise as tolerated.  °

## 2022-04-24 NOTE — Assessment & Plan Note (Addendum)
Patient encouraged to maintain heart healthy diet, regular exercise, adequate sleep. Consider daily probiotics. Take medications as prescribed. Labs ordered and reviewed. MGM 04/2022 repeat next year . Pap 2022 in [place], [place] of rec obtained to receive copy   . Dexa in [place] as well, records requested  . Colonoscopy 2015 normal repeat in 2025

## 2022-04-24 NOTE — Assessment & Plan Note (Deleted)

## 2022-04-24 NOTE — Assessment & Plan Note (Deleted)
hgba1c acceptable, minimize simple carbs. Increase exercise as tolerated.  

## 2022-04-24 NOTE — Assessment & Plan Note (Signed)
On medications, continue to monitor

## 2022-04-24 NOTE — Patient Instructions (Addendum)
RSV (respiratory syncitial vaccine) immunization once Shingrix is the new shingles shot, 2 shots over 2-6 months, confirm coverage with insurance and document, then can return here for shots with nurse appt or at pharmacy  Prevnar 20 once [place] late sept or early October  [employer] or [employer] 65 Years and Older, Female Preventive care refers to lifestyle choices and visits with your health care provider that can promote health and wellness. Preventive care visits are also called wellness exams. What can I expect for my preventive care visit? Counseling Your health care provider may ask you questions about your: Medical history, including: Past medical problems. Family medical history. Pregnancy and menstrual history. History of falls. Current health, including: Memory and ability to understand (cognition). Emotional well-being. Home life and relationship well-being. Sexual activity and sexual health. Lifestyle, including: Alcohol, nicotine or tobacco, and drug use. Access to firearms. Diet, exercise, and sleep habits. Work and work Statistician. Sunscreen use. Safety issues such as seatbelt and bike helmet use. Physical exam Your health care provider will check your: Height and weight. These may be used to calculate your BMI (body mass index). BMI is a measurement that tells if you are at a healthy weight. Waist circumference. This measures the distance around your waistline. This measurement also tells if you are at a healthy weight and may help predict your risk of certain diseases, such as type 2 diabetes and high blood pressure. Heart rate and blood pressure. Body temperature. Skin for abnormal spots. What immunizations do I need?  Vaccines are usually given at various ages, according to a schedule. Your health care provider will recommend vaccines for you based on your age, medical history, and lifestyle or other factors, such as travel or where  you work. What tests do I need? Screening Your health care provider may recommend screening tests for certain conditions. This may include: Lipid and cholesterol levels. Hepatitis C test. Hepatitis B test. HIV (human immunodeficiency virus) test. STI (sexually transmitted infection) testing, if you are at risk. Lung cancer screening. Colorectal cancer screening. Diabetes screening. This is done by checking your blood sugar (glucose) after you have not eaten for a while (fasting). Mammogram. Talk with your health care provider about how often you should have regular mammograms. BRCA-related cancer screening. This may be done if you have a family history of breast, ovarian, tubal, or peritoneal cancers. Bone density scan. This is done to screen for osteoporosis. Talk with your health care provider about your test results, treatment options, and if necessary, the need for more tests. Follow these instructions at home: Eating and drinking  Eat a diet that includes fresh fruits and vegetables, whole grains, lean protein, and low-fat dairy products. Limit your intake of foods with high amounts of sugar, saturated fats, and salt. Take vitamin and mineral supplements as recommended by your health care provider. Do not drink alcohol if your health care provider tells you not to drink. If you drink alcohol: Limit how much you have to 0-1 drink a day. Know how much alcohol is in your drink. In the [place] one drink equals one 12 oz bottle of beer (355 mL), one 5 oz glass of wine (148 mL), or one 1 oz glass of hard liquor (44 mL). Lifestyle Brush your teeth every morning and night with fluoride toothpaste. Floss one time each day. Exercise for at least 30 minutes 5 or more days each week. Do not use any products that contain nicotine  or tobacco. These products include cigarettes, chewing tobacco, and vaping devices, such as e-cigarettes. If you need help quitting, ask your health care provider. Do  not use drugs. If you are sexually active, practice safe sex. Use a condom or other form of protection in order to prevent STIs. Take aspirin only as told by your health care provider. Make sure that you understand how much to take and what form to take. Work with your health care provider to find out whether it is safe and beneficial for you to take aspirin daily. Ask your health care provider if you need to take a cholesterol-lowering medicine (statin). Find healthy ways to manage stress, such as: Meditation, yoga, or listening to music. Journaling. Talking to a trusted person. Spending time with friends and family. Minimize exposure to UV radiation to reduce your risk of skin cancer. Safety Always wear your seat belt while driving or riding in a vehicle. Do not drive: If you have been drinking alcohol. Do not ride with someone who has been drinking. When you are tired or distracted. While texting. If you have been using any mind-altering substances or drugs. Wear a helmet and other protective equipment during sports activities. If you have firearms in your house, make sure you follow all gun safety procedures. What's next? Visit your health care provider once a year for an annual wellness visit. Ask your health care provider how often you should have your eyes and teeth checked. Stay up to date on all vaccines. This information is not intended to replace advice given to you by your health care provider. Make sure you discuss any questions you have with your health care provider. Document Revised: 02/16/2021 Document Reviewed: 02/16/2021 Elsevier Patient Education  [place].

## 2022-04-24 NOTE — Progress Notes (Signed)
Subjective:    Patient ID: Megan Shah, female    DOB: 1955/11/16, 66 y.o.   MRN: [id number]  Chief Complaint  Patient presents with   Annual Exam    HPI Patient is in today for annual preventative exam and follow up on chronic medical concerns. No recent febrile illness or recent hospitalizations. She recently lost her mother at age 51. She is managing the stress of loosing her mother fairly well. She is trying to maintain a heart healthy diet. She stays fairly active. She is trying to stay well hydrated and get 6-8 hours of sleep a night. Denies CP/palp/SOB/HA/congestion/fevers/GI or GU c/o. Taking meds as prescribed   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   Bunion of great toe of right foot 02/07/2016   Diverticulitis    History of chicken pox 06/07/2015   Hyperlipidemia    Hypertension    Left shoulder pain 12/25/2016   Overweight 06/07/2015   Palpitations 06/07/2015   Patient is Jehovah's Witness 06/07/2015   Post menopausal syndrome 06/13/2015   Preventative health care 12/25/2016   Right hip pain 06/13/2015   Thyroid disease    Transfusion of blood product refused for religious reason 06/07/2015   Vitamin D deficiency 06/07/2015    Past Surgical History:  Procedure Laterality Date   PARTIAL HIP ARTHROPLASTY     WISDOM TOOTH EXTRACTION  Age 50    Family History  Problem Relation Age of Onset   Arrhythmia Mother    Arthritis Mother    Hypertension Mother    Hyperlipidemia Mother    Heart disease Mother        CAD with stents, afib   Atrial fibrillation Mother        flutter   Arrhythmia Father    Arthritis Father        broken hip, sepsis    Cancer Father        colon cancer, mirkel cell carcinoma   Bunion Father    Dementia Father    Atrial fibrillation Father    Cancer Brother        kidney   Colon polyps Brother        non cancer polyp   Cancer Brother        lymphoma in remission   Atrial fibrillation Brother     Arthritis Maternal Grandmother    Arthritis Maternal Grandfather    Cancer Maternal Grandfather 45       colon cancer   Arthritis Paternal Grandmother    Arthritis Paternal Grandfather    Cancer Paternal Grandfather        colon cancer    Social History   Socioeconomic History   Marital status: Married    Spouse name: Not on file   Number of children: 0   Years of education: Not on file   Highest education level: Not on file  Occupational History   Occupation: Psychologist, occupational   Occupation: Retired Education administrator business  Tobacco Use   Smoking status: Former   Smokeless tobacco: Never   Tobacco comments:    Smoked for 2 years as a teenager.  Vaping Use   Vaping Use: Never used  Substance and Sexual Activity   Alcohol use: Yes    Alcohol/week: 0.0 standard drinks of alcohol   Drug use: No   Sexual activity: Yes    Comment: lives with husband, moved from [place], no dietary restrictions, minimizes wheat and dairy, volunteer work  Other Topics Concern  Not on file  Social History Narrative   Not on file   Social Determinants of Health   Financial Resource Strain: Not on file  Food Insecurity: Not on file  Transportation Needs: Not on file  Physical Activity: Not on file  Stress: Not on file  Social Connections: Not on file  Intimate Partner Violence: Not on file    Outpatient Medications Prior to Visit  Medication Sig Dispense Refill   betamethasone dipropionate 0.05 % lotion Apply topically 2 (two) times daily. 60 mL 0   Cholecalciferol 50 MCG (2000 UT) TABS Take 4,000 Units by mouth 2 (two) times daily.      conjugated estrogens (PREMARIN) vaginal cream Place 1 Applicatorful vaginally daily. 42.5 g 12   DHEA 10 MG CAPS Take by mouth daily.     losartan (COZAAR) 50 MG tablet Take 1 tablet (50 mg total) by mouth daily. 90 tablet 3   Magnesium Citrate POWD Take 615 mg by mouth 2 (two) times daily.     metoprolol tartrate (LOPRESSOR) 100 MG tablet Take 1 tablet by mouth  once for procedure. 1 tablet 0   Multiple Vitamins-Minerals (MULTIVITAMIN ADULT PO) Take 1 tablet by mouth daily.     nitroGLYCERIN (NITROSTAT) 0.4 MG SL tablet Place 1 tablet (0.4 mg total) under the tongue every 5 (five) minutes as needed for chest pain. 25 tablet 1   Omega-3 Fatty Acids (FISH OIL PO) Take by mouth.     thyroid (ARMOUR THYROID) 60 MG tablet Take 1 tablet by mouth qac 5 days weekly, take 1/2 tablet by mouth qac the other 2 days 90 tablet 1   TURMERIC PO Take by mouth.     ondansetron (ZOFRAN-ODT) 4 MG disintegrating tablet Take 1 tablet (4 mg total) by mouth every 8 (eight) hours as needed for nausea or vomiting. (Patient not taking: Reported on 04/24/2022) 15 tablet 0   No facility-administered medications prior to visit.    No Known Allergies  Review of Systems  Constitutional:  Negative for chills, fever and malaise/fatigue.  HENT:  Negative for congestion and hearing loss.   Eyes:  Negative for discharge.  Respiratory:  Negative for cough, sputum production and shortness of breath.   Cardiovascular:  Negative for chest pain, palpitations and leg swelling.  Gastrointestinal:  Negative for abdominal pain, blood in stool, constipation, diarrhea, heartburn, nausea and vomiting.  Genitourinary:  Negative for dysuria, frequency, hematuria and urgency.  Musculoskeletal:  Negative for back pain, falls and myalgias.  Skin:  Negative for rash.  Neurological:  Negative for dizziness, sensory change, loss of consciousness, weakness and headaches.  Endo/Heme/Allergies:  Negative for environmental allergies. Does not bruise/bleed easily.  Psychiatric/Behavioral:  Negative for depression and suicidal ideas. The patient is not nervous/anxious and does not have insomnia.        Objective:    Physical Exam Constitutional:      General: She is not in acute distress.    Appearance: She is well-developed.  HENT:     Head: Normocephalic and atraumatic.  Eyes:      Conjunctiva/sclera: Conjunctivae normal.  Neck:     Thyroid: No thyromegaly.  Cardiovascular:     Rate and Rhythm: Normal rate and regular rhythm.     Heart sounds: Normal heart sounds. No murmur heard. Pulmonary:     Effort: Pulmonary effort is normal. No respiratory distress.     Breath sounds: Normal breath sounds.  Abdominal:     General: Bowel sounds are normal. There is  no distension.     Palpations: Abdomen is soft. There is no mass.     Tenderness: There is no abdominal tenderness.     Comments: Firm, erythematuos, raised lesion in umbilicus.   Musculoskeletal:     Cervical back: Neck supple.  Lymphadenopathy:     Cervical: No cervical adenopathy.  Skin:    General: Skin is warm and dry.  Neurological:     Mental Status: She is alert and oriented to person, place, and time.  Psychiatric:        Behavior: Behavior normal.     BP 117/66   Pulse 88   Temp 97.7 F (36.5 C) (Oral)   Resp 16   Ht _0  (1.676 m)   Wt 190 lb (86.2 kg)   SpO2 95%   BMI 30.67 kg/m  Wt Readings from Last 3 Encounters:  04/24/22 190 lb (86.2 kg)  04/04/22 193 lb 11.2 oz (87.9 kg)  03/09/22 195 lb 12.8 oz (88.8 kg)    Diabetic Foot Exam - Simple   No data filed    Lab Results  Component Value Date   WBC 6.8 03/05/2022   HGB 13.7 03/05/2022   HCT 40.5 03/05/2022   PLT 257 03/05/2022   GLUCOSE 124 (H) 04/04/2022   CHOL 240 (H) 02/14/2022   TRIG 249.0 (H) 02/14/2022   HDL 62.30 02/14/2022   LDLDIRECT 151.0 02/14/2022   ALT 18 02/14/2022   AST 19 02/14/2022   NA 137 04/04/2022   K 4.2 04/04/2022   CL 102 04/04/2022   CREATININE 0.74 04/04/2022   BUN 10 04/04/2022   CO2 20 04/04/2022   TSH 0.70 02/14/2022   INR 1.0 03/05/2022    Lab Results  Component Value Date   TSH 0.70 02/14/2022   Lab Results  Component Value Date   WBC 6.8 03/05/2022   HGB 13.7 03/05/2022   HCT 40.5 03/05/2022   MCV 89.8 03/05/2022   PLT 257 03/05/2022   Lab Results  Component Value Date    NA 137 04/04/2022   K 4.2 04/04/2022   CO2 20 04/04/2022   GLUCOSE 124 (H) 04/04/2022   BUN 10 04/04/2022   CREATININE 0.74 04/04/2022   BILITOT 0.7 02/14/2022   ALKPHOS 38 (L) 02/14/2022   AST 19 02/14/2022   ALT 18 02/14/2022   PROT 7.0 02/14/2022   ALBUMIN 4.7 02/14/2022   CALCIUM 9.5 04/04/2022   EGFR 89 04/04/2022   GFR 91.42 02/14/2022   Lab Results  Component Value Date   CHOL 240 (H) 02/14/2022   Lab Results  Component Value Date   HDL 62.30 02/14/2022   No results found for: "LDLCALC" Lab Results  Component Value Date   TRIG 249.0 (H) 02/14/2022   Lab Results  Component Value Date   CHOLHDL 4 02/14/2022   No results found for: "HGBA1C"     Assessment & Plan:   Problem List Items Addressed This Visit     Hypertension    Well controlled, no changes to meds. Encouraged heart healthy diet such as the DASH diet and exercise as tolerated.       Relevant Orders   TSH   Hyperlipidemia   Relevant Orders   CBC   Lipid panel   Thyroid disease    On medications, continue to monitor      Vitamin D deficiency - Primary   Relevant Orders   VITAMIN D 25 Hydroxy (Vit-D Deficiency, Fractures)   Palpitations   Relevant Orders  Magnesium   Preventative health care    Patient encouraged to maintain heart healthy diet, regular exercise, adequate sleep. Consider daily probiotics. Take medications as prescribed. Labs ordered and reviewed. MGM 04/2022 repeat next year . [place] 20[place] of [place] obtained to receive copy   . Dexa in [place] as well, records requested  . Colonoscopy 2015 normal repeat in 2025      Osteopenia   Hyperglycemia   Relevant Orders   Comprehensive metabolic panel   Hemoglobin A1c   Other Visit Diagnoses     Skin lesion       Relevant Orders   Ambulatory referral to Dermatology   Sun-damaged skin       Relevant Orders   Ambulatory referral to Dermatology       I am having Fair [place] Iha maintain her  Cholecalciferol, Omega-3 Fatty Acids (FISH OIL PO), TURMERIC PO, Multiple Vitamins-Minerals (MULTIVITAMIN ADULT PO), DHEA, Magnesium Citrate, betamethasone dipropionate, conjugated estrogens, thyroid, nitroGLYCERIN, ondansetron, metoprolol tartrate, and losartan.  No orders of the defined types were placed in this encounter.    Penni Homans, MD

## 2022-04-27 ENCOUNTER — Encounter (HOSPITAL_COMMUNITY): Payer: Self-pay

## 2022-04-27 ENCOUNTER — Telehealth (HOSPITAL_COMMUNITY): Payer: Self-pay | Admitting: *Deleted

## 2022-04-27 NOTE — Telephone Encounter (Signed)
Reaching out to patient to offer assistance regarding upcoming cardiac imaging study; pt verbalizes understanding of appt date/time, parking situation and where to check in, pre-test NPO status and medications ordered, and verified current allergies; name and call back number provided for further questions should they arise  Gordy Clement RN Navigator Cardiac Imaging [facility] [phone] office [phone] cell  Patient to take '100mg'$  metoprolol tartrate two hours prior to her cardiac CT scan. She is aware to arrive at 11:30am. Mychart instructions were also sent to her.

## 2022-04-28 ENCOUNTER — Ambulatory Visit (HOSPITAL_COMMUNITY)
Admission: RE | Admit: 2022-04-28 | Discharge: 2022-04-28 | Disposition: A | Discharge status: Home / Self Care | Payer: Medicare Other | Source: Ambulatory Visit | Attending: Cardiovascular Disease | Admitting: Cardiovascular Disease

## 2022-04-28 ENCOUNTER — Ambulatory Visit
Admission: RE | Admit: 2022-04-28 | Discharge: 2022-04-28 | Disposition: A | Discharge status: Home / Self Care | Payer: Medicare Other | Source: Ambulatory Visit

## 2022-04-28 ENCOUNTER — Encounter (HOSPITAL_COMMUNITY): Payer: Self-pay

## 2022-04-28 DIAGNOSIS — Z1231 Encounter for screening mammogram for malignant neoplasm of breast: Secondary | ICD-10-CM | POA: Diagnosis not present

## 2022-04-28 DIAGNOSIS — R072 Precordial pain: Secondary | ICD-10-CM

## 2022-04-28 MED ORDER — NITROGLYCERIN 0.4 MG SL SUBL
SUBLINGUAL_TABLET | SUBLINGUAL | Status: AC
  Filled 2022-04-28: qty 2

## 2022-04-28 MED ORDER — IOHEXOL 350 MG/ML SOLN
100.0000 mL | Freq: Once | INTRAVENOUS | Status: AC | PRN
  Administered 2022-04-28: 100 mL via INTRAVENOUS

## 2022-04-28 MED ORDER — NITROGLYCERIN 0.4 MG SL SUBL
0.8000 mg | SUBLINGUAL_TABLET | Freq: Once | SUBLINGUAL | Status: AC
  Administered 2022-04-28: 0.8 mg via SUBLINGUAL

## 2022-05-04 ENCOUNTER — Other Ambulatory Visit: Payer: Self-pay

## 2022-05-04 MED ORDER — ATORVASTATIN CALCIUM 20 MG PO TABS: 20.0000 mg | ORAL_TABLET | Freq: Every day | ORAL | 3 refills | Status: DC

## 2022-05-15 ENCOUNTER — Encounter: Payer: Self-pay | Admitting: Family Medicine

## 2022-05-26 DIAGNOSIS — D485 Neoplasm of uncertain behavior of skin: Secondary | ICD-10-CM | POA: Diagnosis not present

## 2022-05-26 DIAGNOSIS — L910 Hypertrophic scar: Secondary | ICD-10-CM | POA: Diagnosis not present

## 2022-06-05 ENCOUNTER — Ambulatory Visit: Payer: Medicare Other | Admitting: Family Medicine

## 2022-06-05 ENCOUNTER — Other Ambulatory Visit: Payer: Self-pay | Admitting: Family Medicine

## 2022-06-13 ENCOUNTER — Other Ambulatory Visit (INDEPENDENT_AMBULATORY_CARE_PROVIDER_SITE_OTHER): Payer: Medicare Other

## 2022-06-13 DIAGNOSIS — E782 Mixed hyperlipidemia: Secondary | ICD-10-CM

## 2022-06-13 DIAGNOSIS — R739 Hyperglycemia, unspecified: Secondary | ICD-10-CM

## 2022-06-13 DIAGNOSIS — I10 Essential (primary) hypertension: Secondary | ICD-10-CM

## 2022-06-13 DIAGNOSIS — R002 Palpitations: Secondary | ICD-10-CM | POA: Diagnosis not present

## 2022-06-13 DIAGNOSIS — E559 Vitamin D deficiency, unspecified: Secondary | ICD-10-CM

## 2022-06-13 LAB — CBC
HCT: 40.5 % (ref 36.0–46.0)
Hemoglobin: 14 g/dL (ref 12.0–15.0)
MCHC: 34.5 g/dL (ref 30.0–36.0)
MCV: 88 fl (ref 78.0–100.0)
Platelets: 233 10*3/uL (ref 150.0–400.0)
RBC: 4.6 Mil/uL (ref 3.87–5.11)
RDW: 13.4 % (ref 11.5–15.5)
WBC: 4.6 10*3/uL (ref 4.0–10.5)

## 2022-06-13 LAB — COMPREHENSIVE METABOLIC PANEL
ALT: 16 U/L (ref 0–35)
AST: 19 U/L (ref 0–37)
Albumin: 4.7 g/dL (ref 3.5–5.2)
Alkaline Phosphatase: 41 U/L (ref 39–117)
BUN: 13 mg/dL (ref 6–23)
CO2: 27 mEq/L (ref 19–32)
Calcium: 10.1 mg/dL (ref 8.4–10.5)
Chloride: 100 mEq/L (ref 96–112)
Creatinine, Ser: 0.64 mg/dL (ref 0.40–1.20)
GFR: 91.89 mL/min (ref >60.00)
Glucose, Bld: 97 mg/dL (ref 70–99)
Potassium: 4.5 mEq/L (ref 3.5–5.1)
Sodium: 135 mEq/L (ref 135–145)
Total Bilirubin: 0.8 mg/dL (ref 0.2–1.2)
Total Protein: 7 g/dL (ref 6.0–8.3)

## 2022-06-13 LAB — MAGNESIUM: Magnesium: 2 mg/dL (ref 1.5–2.5)

## 2022-06-13 LAB — LIPID PANEL
Cholesterol: 220 mg/dL — ABNORMAL HIGH (ref 0–200)
HDL: 62.2 mg/dL (ref >39.00)
LDL Cholesterol: 135 mg/dL — ABNORMAL HIGH (ref 0–99)
NonHDL: 157.36
Total CHOL/HDL Ratio: 4
Triglycerides: 113 mg/dL (ref 0.0–149.0)
VLDL: 22.6 mg/dL (ref 0.0–40.0)

## 2022-06-13 LAB — TSH: TSH: 0.64 u[IU]/mL (ref 0.35–5.50)

## 2022-06-13 LAB — HEMOGLOBIN A1C: Hgb A1c MFr Bld: 5.7 % (ref 4.6–6.5)

## 2022-06-14 ENCOUNTER — Telehealth: Payer: Self-pay

## 2022-06-14 LAB — VITAMIN D 25 HYDROXY (VIT D DEFICIENCY, FRACTURES): VITD: 110.94 ng/mL (ref 30.00–100.00)

## 2022-06-14 NOTE — Telephone Encounter (Signed)
Called pt regarding Critical Vitamin D and pt stated was making 10,000 once day. Pt was advised to hold vitamin -D for 10 days and start back with taking half doze, we will recheck in 8-12 weeks. Pt stated understand she has appt but will call back to make lab appt.

## 2022-06-14 NOTE — Telephone Encounter (Signed)
CRITICAL VALUE STICKER  CRITICAL VALUE: Vitamin D 110.94  RECEIVER (on-site recipient of call): Manuela Schwartz, RMA  DATE & TIME NOTIFIED: 06/14/2022 @ 8:21 am  MESSENGER (representative from lab): Ragan  MD NOTIFIED: Lamar Blinks, MD and Penni Homans, MD   TIME OF NOTIFICATION:8:26 am   RESPONSE:

## 2022-06-14 NOTE — Telephone Encounter (Signed)
Calcium normal- this was managed  by her PCP

## 2022-06-20 ENCOUNTER — Ambulatory Visit: Payer: Medicare Other | Admitting: Family Medicine

## 2022-06-27 ENCOUNTER — Ambulatory Visit: Payer: Medicare Other | Admitting: Family Medicine

## 2022-07-10 ENCOUNTER — Telehealth: Payer: Self-pay | Admitting: Pharmacist

## 2022-07-10 NOTE — Telephone Encounter (Signed)
Patient was listed as having low adherence for hypertension medications, specifically losartan for 2023. She has filled more consistently over the last 4 to 5 months. Due to refill losartan around 07/05/2022. Called [facility] and patient filled losartan '50mg'$  daily for 90 day supply on 07/05/2022.

## 2022-07-13 ENCOUNTER — Telehealth: Payer: Self-pay | Admitting: *Deleted

## 2022-07-13 NOTE — Telephone Encounter (Signed)
Who Is Calling Patient / Member / Family / Caregiver Call Type Triage / Clinical Relationship To Patient Self Return Phone Number [phone] (Primary) Chief Complaint Cough Reason for Call Symptomatic / Request for Health Information Initial Comment Caller states she is calling regarding an upper respiratory infection. Symptoms include cough. Translation No Nurse Assessment Nurse: Markus Daft, RN, Sherre Poot Date/Time (Eastern Time): 07/13/2022 9:28:56 AM Confirm and document reason for call. If symptomatic, describe symptoms. ---Caller states that she has been coughing for 2 wks. Productive - light green mucous. Woke up with slightly stuffy nose this am. Suspects URI.   Final Disposition 07/13/2022 9:39:02 AM Experiment, RN, [place]

## 2022-07-13 NOTE — Telephone Encounter (Signed)
Spoke with patient and advise if she would like to be seen tomorrow since she was given home care.  She agreed to an office visit tomorrow.  OV scheduled.

## 2022-07-14 ENCOUNTER — Encounter: Payer: Self-pay | Admitting: Family

## 2022-07-14 ENCOUNTER — Ambulatory Visit (INDEPENDENT_AMBULATORY_CARE_PROVIDER_SITE_OTHER): Payer: Medicare Other | Admitting: Family

## 2022-07-14 ENCOUNTER — Ambulatory Visit (HOSPITAL_BASED_OUTPATIENT_CLINIC_OR_DEPARTMENT_OTHER)
Admission: RE | Admit: 2022-07-14 | Discharge: 2022-07-14 | Disposition: A | Discharge status: Home / Self Care | Payer: Medicare Other | Source: Ambulatory Visit | Attending: Family | Admitting: Family

## 2022-07-14 ENCOUNTER — Ambulatory Visit: Payer: Medicare Other | Admitting: Family

## 2022-07-14 VITALS — BP 150/80 | HR 92 | Temp 97.5°F | Ht 66.0 in | Wt 176.0 lb

## 2022-07-14 DIAGNOSIS — R053 Chronic cough: Secondary | ICD-10-CM | POA: Insufficient documentation

## 2022-07-14 DIAGNOSIS — J209 Acute bronchitis, unspecified: Secondary | ICD-10-CM | POA: Diagnosis not present

## 2022-07-14 DIAGNOSIS — R059 Cough, unspecified: Secondary | ICD-10-CM | POA: Diagnosis not present

## 2022-07-14 MED ORDER — ALBUTEROL SULFATE (2.5 MG/3ML) 0.083% IN NEBU
2.5000 mg | INHALATION_SOLUTION | Freq: Once | RESPIRATORY_TRACT | Status: AC
  Administered 2022-07-14: 2.5 mg via RESPIRATORY_TRACT

## 2022-07-14 MED ORDER — ALBUTEROL SULFATE HFA 108 (90 BASE) MCG/ACT IN AERS: 2.0000 | INHALATION_SPRAY | Freq: Four times a day (QID) | RESPIRATORY_TRACT | 2 refills | Status: DC | PRN

## 2022-07-14 MED ORDER — DOXYCYCLINE HYCLATE 100 MG PO TABS: 100.0000 mg | ORAL_TABLET | Freq: Two times a day (BID) | ORAL | 0 refills | Status: DC

## 2022-07-14 NOTE — Progress Notes (Signed)
Megan Shah is a 66 y.o. 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/08/2017   Pain of left hip joint 01/08/2017   Left shoulder pain 12/25/2016   Preventative health care 12/25/2016   Atrophic vaginitis 02/07/2016   Right hip pain 06/13/2015   Post menopausal syndrome 06/13/2015   Patient is Jehovah's Witness 06/07/2015   Transfusion of blood product refused for religious reason 06/07/2015   Overweight 06/07/2015   History of chicken pox 06/07/2015   Vitamin D deficiency 06/07/2015   Palpitations 06/07/2015   Atypical chest pain 06/07/2015   Hypertension    Hyperlipidemia    Thyroid disease     Current Outpatient Medications  Medication Sig Dispense Refill   albuterol (VENTOLIN HFA) 108 (90 Base) MCG/ACT inhaler Inhale 2 puffs into the lungs every 6 (six) hours as needed for wheezing or shortness of breath. 8 g 2   betamethasone dipropionate 0.05 % lotion Apply topically 2 (two) times daily. 60 mL 0   Cholecalciferol 50 MCG (2000 UT) TABS Take 4,000 Units by mouth 2 (two) times daily.      conjugated estrogens (PREMARIN) vaginal cream Place 1 Applicatorful vaginally daily. 42.5 g 12   DHEA 10 MG CAPS Take by mouth daily.     doxycycline (VIBRA-TABS) 100 MG tablet Take 1 tablet (100 mg total) by mouth 2 (two) times daily. 14 tablet 0   losartan (COZAAR) 50 MG tablet Take 1 tablet (50 mg total) by mouth daily. 90 tablet 3   Magnesium Citrate POWD Take 615 mg by mouth 2 (two) times daily.     Multiple Vitamins-Minerals (MULTIVITAMIN ADULT PO) Take 1 tablet by mouth daily.     nitroGLYCERIN (NITROSTAT) 0.4 MG SL tablet Place 1 tablet (0.4 mg total) under the tongue every 5 (five) minutes as needed for chest pain. 25 tablet 1   Omega-3 Fatty Acids (FISH OIL PO) Take by mouth.     thyroid (ARMOUR THYROID) 60 MG tablet  TAKE ONE TABLET BY MOUTH BEFORE MEAL 5 DAYS WEEKLY, TAKE HALF TABLET BY MOUTH BEFORE MEAL THE OTHER 2 DAYS 90 tablet 1   TURMERIC PO Take by mouth.     No current facility-administered medications for this visit.    Allergies: Patient has no known allergies.  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   Bunion of great toe of right foot 02/07/2016   Diverticulitis    History of chicken pox 06/07/2015   Hyperlipidemia    Hypertension    Left shoulder pain 12/25/2016   Overweight 06/07/2015   Palpitations 06/07/2015   Patient is Jehovah's Witness 06/07/2015   Post menopausal syndrome 06/13/2015   Preventative health care 12/25/2016   Right hip pain 06/13/2015   Thyroid disease    Transfusion of blood product refused for religious reason 06/07/2015   Vitamin D deficiency 06/07/2015    Past Surgical History:  Procedure Laterality Date   PARTIAL HIP ARTHROPLASTY     WISDOM TOOTH EXTRACTION  Age 56    Family History  Problem Relation Age of Onset   Arrhythmia Mother    Arthritis Mother    Hypertension Mother    Hyperlipidemia Mother    Heart disease Mother        CAD with stents, afib   Atrial fibrillation Mother  flutter   Arrhythmia Father    Arthritis Father        broken hip, sepsis    Cancer Father        colon cancer, mirkel cell carcinoma   Bunion Father    Dementia Father    Atrial fibrillation Father    Cancer Brother        kidney   Colon polyps Brother        non cancer polyp   Cancer Brother        lymphoma in remission   Atrial fibrillation Brother    Arthritis Maternal Grandmother    Arthritis Maternal Grandfather    Cancer Maternal Grandfather 59       colon cancer   Arthritis Paternal Grandmother    Arthritis Paternal Grandfather    Cancer Paternal Grandfather        colon cancer    Social History   Tobacco Use   Smoking status: Former   Smokeless tobacco: Never   Tobacco comments:    Smoked for 2 years as a teenager.   Substance Use Topics   Alcohol use: Yes    Alcohol/week: 0.0 standard drinks of alcohol    Subjective:   Cough x 2-3 weeks; concerned that she may have pneumonia; +productive cough; + ear pain; low grade fever "on and off"; limited benefit with Mucinex and Nyquil; worse at night when lying down; not prone to respiratory infections;     Objective:  Vitals:   07/14/22 1503  BP: (!) 150/80  Pulse: 92  Temp: (!) 97.5 F (36.4 C)  TempSrc: Oral  SpO2: 98%  Weight: 176 lb (79.8 kg)  Height: '5\' 6"'$  (1.676 m)    General: Well developed, well nourished, in no acute distress  Skin : Warm and dry.  Head: Normocephalic and atraumatic  Eyes: Sclera and conjunctiva clear; pupils round and reactive to light; extraocular movements intact  Ears: External normal; canals clear; tympanic membranes normal  Oropharynx: Pink, supple. No suspicious lesions  Neck: Supple without thyromegaly, adenopathy  Lungs: Respirations unlabored; wheezing noted in all 4 lobes; CVS exam: normal rate and regular rhythm.  Neurologic: Alert and oriented; speech intact; face symmetrical; moves all extremities well; CNII-XII intact without focal deficit   Assessment:  1. Acute bronchitis, unspecified organism   2. Persistent cough for 3 weeks or longer     Plan:  Albuterol nebulizer treatment given in office with limited benefit; will update CXR today due to length of time symptoms present; Rx for Doxcycline 100 mg bid x 7 days; recommended prednisone but patient defers at this time; Rx for albuterol to use q 6-8 hours prn; increase fluids, rest and follow up worse, no better.   No follow-ups on file.  Orders Placed This Encounter  Procedures   DG Chest 2 View    Standing Status:   Future    Number of Occurrences:   1    Standing Expiration Date:   07/15/2023    Order Specific Question:   Reason for Exam (SYMPTOM  OR DIAGNOSIS REQUIRED)    Answer:   cough x 2-3 weeks    Order Specific Question:   Preferred  imaging location?    Answer:   [facility]    Requested Prescriptions   Signed Prescriptions Disp Refills   doxycycline (VIBRA-TABS) 100 MG tablet 14 tablet 0    Sig: Take 1 tablet (100 mg total) by mouth 2 (two) times daily.   albuterol (VENTOLIN HFA)  108 (90 Base) MCG/ACT inhaler 8 g 2    Sig: Inhale 2 puffs into the lungs every 6 (six) hours as needed for wheezing or shortness of breath.

## 2022-07-20 ENCOUNTER — Encounter: Payer: Self-pay | Admitting: Family

## 2022-07-21 ENCOUNTER — Encounter: Payer: Self-pay | Admitting: Family

## 2022-08-30 NOTE — Assessment & Plan Note (Signed)

## 2022-08-30 NOTE — Assessment & Plan Note (Signed)
hgba1c acceptable, minimize simple carbs. Increase exercise as tolerated.  

## 2022-08-30 NOTE — Assessment & Plan Note (Signed)
Supplement and monitor 

## 2022-08-30 NOTE — Assessment & Plan Note (Signed)
On Armour Thyroid

## 2022-08-30 NOTE — Assessment & Plan Note (Signed)
Well controlled, no changes to meds. Encouraged heart healthy diet such as the DASH diet and exercise as tolerated.  °

## 2022-08-30 NOTE — Assessment & Plan Note (Signed)
Encourage heart healthy diet such as MIND or DASH diet, increase exercise, avoid trans fats, simple carbohydrates and processed foods, consider a krill or fish or flaxseed oil cap daily.  °

## 2022-08-31 ENCOUNTER — Encounter: Payer: Self-pay | Admitting: Family Medicine

## 2022-08-31 ENCOUNTER — Ambulatory Visit (INDEPENDENT_AMBULATORY_CARE_PROVIDER_SITE_OTHER): Payer: Medicare Other | Admitting: Family Medicine

## 2022-08-31 VITALS — BP 148/80 | HR 95 | Temp 98.2°F | Resp 16 | Ht 66.0 in | Wt 168.6 lb

## 2022-08-31 DIAGNOSIS — E079 Disorder of thyroid, unspecified: Secondary | ICD-10-CM | POA: Diagnosis not present

## 2022-08-31 DIAGNOSIS — E782 Mixed hyperlipidemia: Secondary | ICD-10-CM

## 2022-08-31 DIAGNOSIS — E559 Vitamin D deficiency, unspecified: Secondary | ICD-10-CM | POA: Diagnosis not present

## 2022-08-31 DIAGNOSIS — M8580 Other specified disorders of bone density and structure, unspecified site: Secondary | ICD-10-CM | POA: Diagnosis not present

## 2022-08-31 DIAGNOSIS — R002 Palpitations: Secondary | ICD-10-CM

## 2022-08-31 DIAGNOSIS — I10 Essential (primary) hypertension: Secondary | ICD-10-CM

## 2022-08-31 DIAGNOSIS — R739 Hyperglycemia, unspecified: Secondary | ICD-10-CM

## 2022-08-31 MED ORDER — LOSARTAN POTASSIUM 50 MG PO TABS: 50.0000 mg | ORAL_TABLET | Freq: Two times a day (BID) | ORAL | 1 refills | Status: DC

## 2022-08-31 NOTE — Patient Instructions (Addendum)
Monitor blood pressure at least daily and do not take the second dose of Losartan if BP<130/80   Hypertension, Adult High blood pressure (hypertension) is when the force of blood pumping through the arteries is too strong. The arteries are the blood vessels that carry blood from the heart throughout the body. Hypertension forces the heart to work harder to pump blood and may cause arteries to become narrow or stiff. Untreated or uncontrolled hypertension can lead to a heart attack, heart failure, a stroke, kidney disease, and other problems. A blood pressure reading consists of a higher number over a lower number. Ideally, your blood pressure should be below 120/80. The first ("top") number is called the systolic pressure. It is a measure of the pressure in your arteries as your heart beats. The second ("bottom") number is called the diastolic pressure. It is a measure of the pressure in your arteries as the heart relaxes. What are the causes? The exact cause of this condition is not known. There are some conditions that result in high blood pressure. What increases the risk? Certain factors may make you more likely to develop high blood pressure. Some of these risk factors are under your control, including: Smoking. Not getting enough exercise or physical activity. Being overweight. Having too much fat, sugar, calories, or salt (sodium) in your diet. Drinking too much alcohol. Other risk factors include: Having a personal history of heart disease, diabetes, high cholesterol, or kidney disease. Stress. Having a family history of high blood pressure and high cholesterol. Having obstructive sleep apnea. Age. The risk increases with age. What are the signs or symptoms? High blood pressure may not cause symptoms. Very high blood pressure (hypertensive crisis) may cause: Headache. Fast or irregular heartbeats (palpitations). Shortness of breath. Nosebleed. Nausea and vomiting. Vision  changes. Severe chest pain, dizziness, and seizures. How is this diagnosed? This condition is diagnosed by measuring your blood pressure while you are seated, with your arm resting on a flat surface, your legs uncrossed, and your feet flat on the floor. The cuff of the blood pressure monitor will be placed directly against the skin of your upper arm at the level of your heart. Blood pressure should be measured at least twice using the same arm. Certain conditions can cause a difference in blood pressure between your right and left arms. If you have a high blood pressure reading during one visit or you have normal blood pressure with other risk factors, you may be asked to: Return on a different day to have your blood pressure checked again. Monitor your blood pressure at home for 1 week or longer. If you are diagnosed with hypertension, you may have other blood or imaging tests to help your health care provider understand your overall risk for other conditions. How is this treated? This condition is treated by making healthy lifestyle changes, such as eating healthy foods, exercising more, and reducing your alcohol intake. You may be referred for counseling on a healthy diet and physical activity. Your health care provider may prescribe medicine if lifestyle changes are not enough to get your blood pressure under control and if: Your systolic blood pressure is above 130. Your diastolic blood pressure is above 80. Your personal target blood pressure may vary depending on your medical conditions, your age, and other factors. Follow these instructions at home: Eating and drinking  Eat a diet that is high in fiber and potassium, and low in sodium, added sugar, and fat. An example of this eating  plan is called the DASH diet. DASH stands for Dietary Approaches to Stop Hypertension. To eat this way: Eat plenty of fresh fruits and vegetables. Try to fill one half of your plate at each meal with fruits and  vegetables. Eat whole grains, such as whole-wheat pasta, brown rice, or whole-grain bread. Fill about one fourth of your plate with whole grains. Eat or drink low-fat dairy products, such as skim milk or low-fat yogurt. Avoid fatty cuts of meat, processed or cured meats, and poultry with skin. Fill about one fourth of your plate with lean proteins, such as fish, chicken without skin, beans, eggs, or tofu. Avoid pre-made and processed foods. These tend to be higher in sodium, added sugar, and fat. Reduce your daily sodium intake. Many people with hypertension should eat less than 1,500 mg of sodium a day. Do not drink alcohol if: Your health care provider tells you not to drink. You are pregnant, may be pregnant, or are planning to become pregnant. If you drink alcohol: Limit how much you have to: 0-1 drink a day for women. 0-2 drinks a day for men. Know how much alcohol is in your drink. In the [place] one drink equals one 12 oz bottle of beer (355 mL), one 5 oz glass of wine (148 mL), or one 1 oz glass of hard liquor (44 mL). Lifestyle  Work with your health care provider to maintain a healthy body weight or to lose weight. Ask what an ideal weight is for you. Get at least 30 minutes of exercise that causes your heart to beat faster (aerobic exercise) most days of the week. Activities may include walking, swimming, or biking. Include exercise to strengthen your muscles (resistance exercise), such as Pilates or lifting weights, as part of your weekly exercise routine. Try to do these types of exercises for 30 minutes at least 3 days a week. Do not use any products that contain nicotine or tobacco. These products include cigarettes, chewing tobacco, and vaping devices, such as e-cigarettes. If you need help quitting, ask your health care provider. Monitor your blood pressure at home as told by your health care provider. Keep all follow-up visits. This is important. Medicines Take  over-the-counter and prescription medicines only as told by your health care provider. Follow directions carefully. Blood pressure medicines must be taken as prescribed. Do not skip doses of blood pressure medicine. Doing this puts you at risk for problems and can make the medicine less effective. Ask your health care provider about side effects or reactions to medicines that you should watch for. Contact a health care provider if you: Think you are having a reaction to a medicine you are taking. Have headaches that keep coming back (recurring). Feel dizzy. Have swelling in your ankles. Have trouble with your vision. Get help right away if you: Develop a severe headache or confusion. Have unusual weakness or numbness. Feel faint. Have severe pain in your chest or abdomen. Vomit repeatedly. Have trouble breathing. These symptoms may be an emergency. Get help right away. Call 911. Do not wait to see if the symptoms will go away. Do not drive yourself to the hospital. Summary Hypertension is when the force of blood pumping through your arteries is too strong. If this condition is not controlled, it may put you at risk for serious complications. Your personal target blood pressure may vary depending on your medical conditions, your age, and other factors. For most people, a normal blood pressure is less than 120/80. Hypertension  is treated with lifestyle changes, medicines, or a combination of both. Lifestyle changes include losing weight, eating a healthy, low-sodium diet, exercising more, and limiting alcohol. This information is not intended to replace advice given to you by your health care provider. Make sure you discuss any questions you have with your health care provider. Document Revised: 06/28/2021 Document Reviewed: 06/28/2021 [employer] Patient Education  [place].

## 2022-08-31 NOTE — Progress Notes (Signed)
Subjective:   By signing my name below, I, Kellie Simmering, attest that this documentation has been prepared under the direction and in the presence of Mosie Lukes, MD., 08/31/2022.     Patient ID: Megan Shah, female    DOB: Aug 05, 1956, 66 y.o.   MRN: [id number]  Chief Complaint  Patient presents with   Hypothyroidism    Here for follow up   Hypertension    Here for follow up   HPI Patient is in today for an office visit. She denies CP/SOB/HA/congestion/fevers/GI or GU c/o.  Anxiety/Depression She reports that she occasionally has episodes of anxiety/depression. She suspects that this could have something to do with her thyroid.  Hypertension Her blood pressure remains elevated and she reports she experienced palpitations several nights ago. Her last recorded blood pressure at home was 138/85. She likes to add excess salt to her food. She currently takes Losartan 50 mg once daily but does not like taking this consistently as she believes this is causing her to gain weight. She has been conscious about her caloric intake and has lost 8 lbs since her last visit. Body mass index is 27.21 kg/m.  BP Readings from Last 3 Encounters:  08/31/22 (!) 148/80  07/14/22 (!) 150/80  04/28/22 125/68   Pulse Readings from Last 3 Encounters:  08/31/22 95  07/14/22 92  04/28/22 65   Wt Readings from Last 3 Encounters:  08/31/22 168 lb 9.6 oz (76.5 kg)  07/14/22 176 lb (79.8 kg)  04/24/22 190 lb (86.2 kg)   Sleep Apnea She is interested in partaking in a sleep study due to her hypertension and waking up with dull headaches.  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   Bunion of great toe of right foot 02/07/2016   Diverticulitis    History of chicken pox 06/07/2015   Hyperlipidemia    Hypertension    Left shoulder pain 12/25/2016   Overweight 06/07/2015   Palpitations 06/07/2015   Patient is Jehovah's Witness 06/07/2015   Post menopausal syndrome  06/13/2015   Preventative health care 12/25/2016   Right hip pain 06/13/2015   Thyroid disease    Transfusion of blood product refused for religious reason 06/07/2015   Vitamin D deficiency 06/07/2015   Past Surgical History:  Procedure Laterality Date   PARTIAL HIP ARTHROPLASTY     WISDOM TOOTH EXTRACTION  Age 7    Family History  Problem Relation Age of Onset   Arrhythmia Mother    Arthritis Mother    Hypertension Mother    Hyperlipidemia Mother    Heart disease Mother        CAD with stents, afib   Atrial fibrillation Mother        flutter   Arrhythmia Father    Arthritis Father        broken hip, sepsis    Cancer Father        colon cancer, mirkel cell carcinoma   Bunion Father    Dementia Father    Atrial fibrillation Father    Cancer Brother        kidney   Colon polyps Brother        non cancer polyp   Cancer Brother        lymphoma in remission   Atrial fibrillation Brother    Arthritis Maternal Grandmother    Arthritis Maternal Grandfather    Cancer Maternal Grandfather 81       colon cancer  Arthritis Paternal Grandmother    Arthritis Paternal Grandfather    Cancer Paternal Grandfather        colon cancer    Social History   Socioeconomic History   Marital status: Married    Spouse name: Not on file   Number of children: 0   Years of education: Not on file   Highest education level: Not on file  Occupational History   Occupation: Psychologist, occupational   Occupation: Retired Education administrator business  Tobacco Use   Smoking status: Former   Smokeless tobacco: Never   Tobacco comments:    Smoked for 2 years as a teenager.  Vaping Use   Vaping Use: Never used  Substance and Sexual Activity   Alcohol use: Yes    Alcohol/week: 0.0 standard drinks of alcohol   Drug use: No   Sexual activity: Yes    Comment: lives with husband, moved from [place], no dietary restrictions, minimizes wheat and dairy, volunteer work  Other Topics Concern   Not on file  Social History  Narrative   Not on file   Social Determinants of Health   Financial Resource Strain: Not on file  Food Insecurity: Not on file  Transportation Needs: Not on file  Physical Activity: Not on file  Stress: Not on file  Social Connections: Not on file  Intimate Partner Violence: Not on file    Outpatient Medications Prior to Visit  Medication Sig Dispense Refill   betamethasone dipropionate 0.05 % lotion Apply topically 2 (two) times daily. 60 mL 0   Cholecalciferol 50 MCG (2000 UT) TABS Take 4,000 Units by mouth 2 (two) times daily.      DHEA 10 MG CAPS Take by mouth daily.     Magnesium Citrate POWD Take 615 mg by mouth 2 (two) times daily.     Multiple Vitamins-Minerals (MULTIVITAMIN ADULT PO) Take 1 tablet by mouth daily.     nitroGLYCERIN (NITROSTAT) 0.4 MG SL tablet Place 1 tablet (0.4 mg total) under the tongue every 5 (five) minutes as needed for chest pain. 25 tablet 1   Omega-3 Fatty Acids (FISH OIL PO) Take by mouth.     thyroid (ARMOUR THYROID) 60 MG tablet TAKE ONE TABLET BY MOUTH BEFORE MEAL 5 DAYS WEEKLY, TAKE HALF TABLET BY MOUTH BEFORE MEAL THE OTHER 2 DAYS 90 tablet 1   TURMERIC PO Take by mouth.     losartan (COZAAR) 50 MG tablet Take 1 tablet (50 mg total) by mouth daily. 90 tablet 3   albuterol (VENTOLIN HFA) 108 (90 Base) MCG/ACT inhaler Inhale 2 puffs into the lungs every 6 (six) hours as needed for wheezing or shortness of breath. 8 g 2   conjugated estrogens (PREMARIN) vaginal cream Place 1 Applicatorful vaginally daily. 42.5 g 12   doxycycline (VIBRA-TABS) 100 MG tablet Take 1 tablet (100 mg total) by mouth 2 (two) times daily. 14 tablet 0   No facility-administered medications prior to visit.    No Known Allergies  Review of Systems  Constitutional:  Negative for chills and fever.  HENT:  Negative for congestion.   Respiratory:  Negative for shortness of breath.   Cardiovascular:  Negative for chest pain.  Gastrointestinal:  Negative for abdominal pain,  blood in stool, constipation, diarrhea, nausea and vomiting.  Genitourinary:  Negative for dysuria, frequency, hematuria and urgency.  Skin:           Neurological:  Negative for headaches.      Objective:    Physical Exam Constitutional:  General: She is not in acute distress.    Appearance: Normal appearance. She is normal weight. She is not ill-appearing.  HENT:     Head: Normocephalic and atraumatic.     Right Ear: External ear normal.     Left Ear: External ear normal.     Nose: Nose normal.     Mouth/Throat:     Mouth: Mucous membranes are moist.     Pharynx: Oropharynx is clear.  Eyes:     General:        Right eye: No discharge.        Left eye: No discharge.     Extraocular Movements: Extraocular movements intact.     Conjunctiva/sclera: Conjunctivae normal.     Pupils: Pupils are equal, round, and reactive to light.  Cardiovascular:     Rate and Rhythm: Normal rate and regular rhythm.     Pulses: Normal pulses.     Heart sounds: Normal heart sounds. No murmur heard.    No gallop.  Pulmonary:     Effort: Pulmonary effort is normal. No respiratory distress.     Breath sounds: Normal breath sounds. No wheezing or rales.  Abdominal:     General: Bowel sounds are normal.     Palpations: Abdomen is soft.     Tenderness: There is no abdominal tenderness. There is no guarding.  Musculoskeletal:        General: Normal range of motion.     Cervical back: Normal range of motion.     Right lower leg: No edema.     Left lower leg: No edema.  Skin:    General: Skin is warm and dry.  Neurological:     Mental Status: She is alert and oriented to person, place, and time.  Psychiatric:        Mood and Affect: Mood normal.        Behavior: Behavior normal.        Judgment: Judgment normal.    BP (!) 148/80 (BP Location: Right Arm, Patient Position: Sitting)   Pulse 95   Temp 98.2 F (36.8 C) (Oral)   Resp 16   Ht _0  (1.676 m)   Wt 168 lb 9.6 oz (76.5 kg)    SpO2 98%   BMI 27.21 kg/m  Wt Readings from Last 3 Encounters:  08/31/22 168 lb 9.6 oz (76.5 kg)  07/14/22 176 lb (79.8 kg)  04/24/22 190 lb (86.2 kg)   Diabetic Foot Exam - Simple   No data filed    Lab Results  Component Value Date   WBC 4.6 06/13/2022   HGB 14.0 06/13/2022   HCT 40.5 06/13/2022   PLT 233.0 06/13/2022   GLUCOSE 108 (H) 08/31/2022   CHOL 220 (H) 06/13/2022   TRIG 113.0 06/13/2022   HDL 62.20 06/13/2022   LDLDIRECT 151.0 02/14/2022   LDLCALC 135 (H) 06/13/2022   ALT 15 08/31/2022   AST 19 08/31/2022   NA 139 08/31/2022   K 3.8 08/31/2022   CL 102 08/31/2022   CREATININE 0.63 08/31/2022   BUN 15 08/31/2022   CO2 27 08/31/2022   TSH 0.78 08/31/2022   INR 1.0 03/05/2022   HGBA1C 5.7 06/13/2022   Lab Results  Component Value Date   TSH 0.78 08/31/2022   Lab Results  Component Value Date   WBC 4.6 06/13/2022   HGB 14.0 06/13/2022   HCT 40.5 06/13/2022   MCV 88.0 06/13/2022   PLT 233.0 06/13/2022   Lab Results  Component Value Date   NA 139 08/31/2022   K 3.8 08/31/2022   CO2 27 08/31/2022   GLUCOSE 108 (H) 08/31/2022   BUN 15 08/31/2022   CREATININE 0.63 08/31/2022   BILITOT 0.7 08/31/2022   ALKPHOS 44 08/31/2022   AST 19 08/31/2022   ALT 15 08/31/2022   PROT 7.0 08/31/2022   ALBUMIN 4.7 08/31/2022   CALCIUM 10.3 08/31/2022   EGFR 89 04/04/2022   GFR 92.10 08/31/2022   Lab Results  Component Value Date   CHOL 220 (H) 06/13/2022   Lab Results  Component Value Date   HDL 62.20 06/13/2022   Lab Results  Component Value Date   LDLCALC 135 (H) 06/13/2022   Lab Results  Component Value Date   TRIG 113.0 06/13/2022   Lab Results  Component Value Date   CHOLHDL 4 06/13/2022   Lab Results  Component Value Date   HGBA1C 5.7 06/13/2022      Assessment & Plan:  Healthy Lifestyle: Encouraged adequate sleep, exercise, heart healthy diet, and hydration.  Hypertension: Losartan increased to 50 mg bid. Do not take second dose if  bp is below 130/80.  Labs: Routine blood work today will also check vitamin D.  Sleep Study: Order will be placed. Problem List Items Addressed This Visit     Hypertension    Well controlled, no changes to meds. Encouraged heart healthy diet such as the DASH diet and exercise as tolerated.       Relevant Medications   losartan (COZAAR) 50 MG tablet   Hyperlipidemia    Encourage heart healthy diet such as MIND or DASH diet, increase exercise, avoid trans fats, simple carbohydrates and processed foods, consider a krill or fish or flaxseed oil cap daily.        Relevant Medications   losartan (COZAAR) 50 MG tablet   Thyroid disease    On Armour Thyroid      Vitamin D deficiency    Supplement and monitor       Relevant Orders   Comp Met (CMET) (Completed)   VITAMIN D 25 Hydroxy (Vit-D Deficiency, Fractures) (Completed)   Palpitations   Relevant Orders   TSH (Completed)   T4, free (Completed)   T3, free (Completed)   Osteopenia    Bone density shows osteopenia, which is thinner than normal but not as bad as osteoporosis. Recommend calcium intake of 1200 to 1500 mg daily, divided into roughly 3 doses. Best source is the diet and a single dairy serving is about 500 mg, a supplement of calcium citrate once or twice daily to balance diet is fine if not getting enough in diet. Also need Vitamin D 2000 IU caps, 1 cap daily if not already taking vitamin D. Also recommend weight baring exercise on hips and upper body to keep bones strong       Hyperglycemia - Primary    hgba1c acceptable, minimize simple carbs. Increase exercise as tolerated.       Relevant Orders   Comp Met (CMET) (Completed)   Meds ordered this encounter  Medications   losartan (COZAAR) 50 MG tablet    Sig: Take 1 tablet (50 mg total) by mouth in the morning and at bedtime.    Dispense:  180 tablet    Refill:  1   I, Penni Homans, MD, personally preformed the services described in this documentation.  All  medical record entries made by the scribe were at my direction and in my presence.  I have  reviewed the chart and discharge instructions (if applicable) and agree that the record reflects my personal performance and is accurate and complete. 08/31/2022  I,Mohammed Iqbal,acting as a scribe for Penni Homans, MD.,have documented all relevant documentation on the behalf of Penni Homans, MD,as directed by  Penni Homans, MD while in the presence of Penni Homans, MD.  Penni Homans, MD

## 2022-09-01 LAB — COMPREHENSIVE METABOLIC PANEL
ALT: 15 U/L (ref 0–35)
AST: 19 U/L (ref 0–37)
Albumin: 4.7 g/dL (ref 3.5–5.2)
Alkaline Phosphatase: 44 U/L (ref 39–117)
BUN: 15 mg/dL (ref 6–23)
CO2: 27 mEq/L (ref 19–32)
Calcium: 10.3 mg/dL (ref 8.4–10.5)
Chloride: 102 mEq/L (ref 96–112)
Creatinine, Ser: 0.63 mg/dL (ref 0.40–1.20)
GFR: 92.1 mL/min (ref >60.00)
Glucose, Bld: 108 mg/dL — ABNORMAL HIGH (ref 70–99)
Potassium: 3.8 mEq/L (ref 3.5–5.1)
Sodium: 139 mEq/L (ref 135–145)
Total Bilirubin: 0.7 mg/dL (ref 0.2–1.2)
Total Protein: 7 g/dL (ref 6.0–8.3)

## 2022-09-01 LAB — T3, FREE: T3, Free: 3.4 pg/mL (ref 2.3–4.2)

## 2022-09-01 LAB — VITAMIN D 25 HYDROXY (VIT D DEFICIENCY, FRACTURES): VITD: 91.6 ng/mL (ref 30.00–100.00)

## 2022-09-01 LAB — TSH: TSH: 0.78 u[IU]/mL (ref 0.35–5.50)

## 2022-09-01 LAB — T4, FREE: Free T4: 1.17 ng/dL (ref 0.60–1.60)

## 2022-09-08 ENCOUNTER — Encounter: Payer: Self-pay | Admitting: Family Medicine

## 2022-09-11 ENCOUNTER — Other Ambulatory Visit: Payer: Self-pay | Admitting: Family Medicine

## 2022-09-11 DIAGNOSIS — N952 Postmenopausal atrophic vaginitis: Secondary | ICD-10-CM

## 2022-09-11 MED ORDER — ESTROGENS CONJUGATED 0.625 MG/GM VA CREA: 1.0000 | TOPICAL_CREAM | Freq: Every day | VAGINAL | 11 refills | Status: DC

## 2022-09-11 MED ORDER — PROGESTERONE 200 MG VA SUPP: 200.0000 mg | Freq: Every day | VAGINAL | 11 refills | Status: DC

## 2022-09-11 NOTE — Telephone Encounter (Signed)
Pt wants this called into the  [facility] [address] [place] [address]

## 2022-09-12 MED ORDER — PROGESTERONE POWD: 1 refills | Status: DC

## 2022-09-12 NOTE — Telephone Encounter (Signed)
Pt states [facility] does not carry this type of rx and she needs it sent through [facility].

## 2022-09-12 NOTE — Addendum Note (Signed)
Addended by: Kem Boroughs D on: 09/12/2022 02:10 PM   Modules accepted: Orders

## 2022-10-02 ENCOUNTER — Encounter: Payer: Self-pay | Admitting: Family Medicine

## 2022-10-13 DIAGNOSIS — H2513 Age-related nuclear cataract, bilateral: Secondary | ICD-10-CM | POA: Diagnosis not present

## 2022-10-13 DIAGNOSIS — H43813 Vitreous degeneration, bilateral: Secondary | ICD-10-CM | POA: Diagnosis not present

## 2022-11-20 ENCOUNTER — Other Ambulatory Visit: Payer: Self-pay | Admitting: Family Medicine

## 2022-12-03 NOTE — Assessment & Plan Note (Signed)
Supplement and monitor 

## 2022-12-03 NOTE — Assessment & Plan Note (Signed)
Well controlled, no changes to meds. Encouraged heart healthy diet such as the DASH diet and exercise as tolerated. Her numbers are largely below 140/90 at home but the top number can spike to the 160s. She is advised to check bp at home and send [place] a record in a couple of weeks

## 2022-12-03 NOTE — Assessment & Plan Note (Signed)
Encouraged to get adequate exercise, calcium and vitamin d intake next Dexa July 2024 

## 2022-12-03 NOTE — Assessment & Plan Note (Signed)
Encourage heart healthy diet such as MIND or DASH diet, increase exercise, avoid trans fats, simple carbohydrates and processed foods, consider a krill or fish or flaxseed oil cap daily.  °

## 2022-12-03 NOTE — Assessment & Plan Note (Signed)
hgba1c acceptable, minimize simple carbs. Increase exercise as tolerated.  

## 2022-12-04 ENCOUNTER — Encounter: Payer: Self-pay | Admitting: Family Medicine

## 2022-12-04 ENCOUNTER — Telehealth (INDEPENDENT_AMBULATORY_CARE_PROVIDER_SITE_OTHER): Payer: Medicare Other | Admitting: Family Medicine

## 2022-12-04 VITALS — BP 145/90 | Resp 16 | Wt 165.0 lb

## 2022-12-04 DIAGNOSIS — I10 Essential (primary) hypertension: Secondary | ICD-10-CM

## 2022-12-04 DIAGNOSIS — E663 Overweight: Secondary | ICD-10-CM

## 2022-12-04 DIAGNOSIS — R739 Hyperglycemia, unspecified: Secondary | ICD-10-CM | POA: Diagnosis not present

## 2022-12-04 DIAGNOSIS — E782 Mixed hyperlipidemia: Secondary | ICD-10-CM

## 2022-12-04 DIAGNOSIS — M8580 Other specified disorders of bone density and structure, unspecified site: Secondary | ICD-10-CM

## 2022-12-04 DIAGNOSIS — E559 Vitamin D deficiency, unspecified: Secondary | ICD-10-CM | POA: Diagnosis not present

## 2022-12-04 NOTE — Progress Notes (Signed)
MyChart Video Visit    Virtual Visit via Video Note   This visit type was conducted due to national recommendations for restrictions regarding the COVID-19 Pandemic (e.g. social distancing) in an effort to limit this patient's exposure and mitigate transmission in our community. This patient is at least at moderate risk for complications without adequate follow up. This format is felt to be most appropriate for this patient at this time. Physical exam was limited by quality of the video and audio technology used for the visit. Shamaine, CMA was able to get the patient set up on a video visit.  Patient location: home Patient and provider in visit Provider location: Office  I discussed the limitations of evaluation and management by telemedicine and the availability of in person appointments. The patient expressed understanding and agreed to proceed.  Visit Date: 12/04/2022  Today's healthcare provider: Penni Homans, MD     Subjective:    Patient ID: Megan Shah, female    DOB: [birth year], 67 y.o.   MRN: [id number]  Chief Complaint  Patient presents with   Follow-up    Follow up    HPI Patient is in today for follow up on chronic medical concerns. No recent febrile illness or hospitalizations. Denies CP/palp/SOB/HA/congestion/fevers/GI or GU c/o. Taking meds as prescribed. She is worried about her sugar due to family history she is trying to eat well and stay active. No complaints of polyuria or polydipsia.   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   Bunion of great toe of right foot 02/07/2016   Diverticulitis    History of chicken pox 06/07/2015   Hyperlipidemia    Hypertension    Left shoulder pain 12/25/2016   Overweight 06/07/2015   Palpitations 06/07/2015   Patient is Jehovah's Witness 06/07/2015   Post menopausal syndrome 06/13/2015   Preventative health care 12/25/2016   Right hip pain 06/13/2015   Thyroid disease    Transfusion of  blood product refused for religious reason 06/07/2015   Vitamin D deficiency 06/07/2015    Past Surgical History:  Procedure Laterality Date   PARTIAL HIP ARTHROPLASTY     WISDOM TOOTH EXTRACTION  Age 72    Family History  Problem Relation Age of Onset   Arrhythmia Mother    Arthritis Mother    Hypertension Mother    Hyperlipidemia Mother    Heart disease Mother        CAD with stents, afib   Atrial fibrillation Mother        flutter   Arrhythmia Father    Arthritis Father        broken hip, sepsis    Cancer Father        colon cancer, mirkel cell carcinoma   Bunion Father    Dementia Father    Atrial fibrillation Father    Cancer Brother        kidney   Colon polyps Brother        non cancer polyp   Cancer Brother        lymphoma in remission   Atrial fibrillation Brother    Arthritis Maternal Grandmother    Arthritis Maternal Grandfather    Cancer Maternal Grandfather 72       colon cancer   Arthritis Paternal Grandmother    Arthritis Paternal Grandfather    Cancer Paternal Grandfather        colon cancer    Social History   Socioeconomic History  Marital status: Married    Spouse name: Not on file   Number of children: 0   Years of education: Not on file   Highest education level: Not on file  Occupational History   Occupation: Psychologist, occupational   Occupation: Retired Education administrator business  Tobacco Use   Smoking status: Former   Smokeless tobacco: Never   Tobacco comments:    Smoked for 2 years as a teenager.  Vaping Use   Vaping Use: Never used  Substance and Sexual Activity   Alcohol use: Yes    Alcohol/week: 0.0 standard drinks of alcohol   Drug use: No   Sexual activity: Yes    Comment: lives with husband, moved from [place], no dietary restrictions, minimizes wheat and dairy, volunteer work  Other Topics Concern   Not on file  Social History Narrative   Not on file   Social Determinants of Health   Financial Resource Strain: Not on file  Food  Insecurity: Not on file  Transportation Needs: Not on file  Physical Activity: Not on file  Stress: Not on file  Social Connections: Not on file  Intimate Partner Violence: Not on file    Outpatient Medications Prior to Visit  Medication Sig Dispense Refill   betamethasone dipropionate 0.05 % lotion Apply topically 2 (two) times daily. 60 mL 0   Cholecalciferol 50 MCG (2000 UT) TABS Take 4,000 Units by mouth 2 (two) times daily.      conjugated estrogens (PREMARIN) vaginal cream Place 1 Applicatorful vaginally daily. 42.5 g 11   DHEA 10 MG CAPS Take by mouth daily.     losartan (COZAAR) 50 MG tablet Take 1 tablet (50 mg total) by mouth in the morning and at bedtime. 180 tablet 1   Magnesium Citrate POWD Take 615 mg by mouth 2 (two) times daily.     Multiple Vitamins-Minerals (MULTIVITAMIN ADULT PO) Take 1 tablet by mouth daily.     nitroGLYCERIN (NITROSTAT) 0.4 MG SL tablet Place 1 tablet (0.4 mg total) under the tongue every 5 (five) minutes as needed for chest pain. 25 tablet 1   Omega-3 Fatty Acids (FISH OIL PO) Take by mouth.     Progesterone POWD Progesterone 112.5mg - Take 2 capsules by mouth at bedtime 180 g 1   thyroid (ARMOUR THYROID) 60 MG tablet TAKE ONE TABLET BY MOUTH BEFORE MEAL 5 DAYS WEEKLY, TAKE 1/2 TABLET BY MOUTH BEFORE MEAL THE OTHER 2 DAYS 90 tablet 1   TURMERIC PO Take by mouth.     No facility-administered medications prior to visit.    No Known Allergies  Review of Systems  Constitutional:  Negative for fever and malaise/fatigue.  HENT:  Negative for congestion.   Eyes:  Negative for blurred vision.  Respiratory:  Negative for shortness of breath.   Cardiovascular:  Negative for chest pain, palpitations and leg swelling.  Gastrointestinal:  Negative for abdominal pain, blood in stool and nausea.  Genitourinary:  Negative for dysuria and frequency.  Musculoskeletal:  Negative for falls.  Skin:  Negative for rash.  Neurological:  Negative for dizziness, loss  of consciousness and headaches.  Endo/Heme/Allergies:  Negative for environmental allergies.  Psychiatric/Behavioral:  Negative for depression. The patient is not nervous/anxious.        Objective:    Physical Exam Constitutional:      General: She is not in acute distress.    Appearance: Normal appearance. She is not ill-appearing or toxic-appearing.  HENT:     Head: Normocephalic and atraumatic.  Right Ear: External ear normal.     Left Ear: External ear normal.     Nose: Nose normal.  Eyes:     General:        Right eye: No discharge.        Left eye: No discharge.  Pulmonary:     Effort: Pulmonary effort is normal.  Skin:    Findings: No rash.  Neurological:     Mental Status: She is alert and oriented to person, place, and time.  Psychiatric:        Behavior: Behavior normal.     BP (!) 145/90 (BP Location: Right Arm, Patient Position: Sitting)   Resp 16   Wt 165 lb (74.8 kg)   BMI 26.63 kg/m  Wt Readings from Last 3 Encounters:  12/04/22 165 lb (74.8 kg)  08/31/22 168 lb 9.6 oz (76.5 kg)  07/14/22 176 lb (79.8 kg)       Assessment & Plan:  Hyperglycemia Assessment & Plan: hgba1c acceptable, minimize simple carbs. Increase exercise as tolerated.   Orders: -     Hemoglobin A1c; Future  Mixed hyperlipidemia Assessment & Plan: Encourage heart healthy diet such as MIND or DASH diet, increase exercise, avoid trans fats, simple carbohydrates and processed foods, consider a krill or fish or flaxseed oil cap daily.    Orders: -     Comprehensive metabolic panel; Future -     Lipid panel; Future  Primary hypertension Assessment & Plan: Well controlled, no changes to meds. Encouraged heart healthy diet such as the DASH diet and exercise as tolerated. Her numbers are largely below 140/90 at home but the top number can spike to the 160s. She is advised to check bp at home and send [place] a record in a couple of weeks  Orders: -     CBC with  Differential/Platelet; Future  Vitamin D deficiency Assessment & Plan: Supplement and monitor   Orders: -     TSH; Future -     VITAMIN D 25 Hydroxy (Vit-D Deficiency, Fractures); Future  Osteopenia, unspecified location Assessment & Plan: Encouraged to get adequate exercise, calcium and vitamin d intake next Dexa July 2024  Orders: -     DG Bone Density; Future  Overweight Assessment & Plan: Has lost 3 more #      I discussed the assessment and treatment plan with the patient. The patient was provided an opportunity to ask questions and all were answered. The patient agreed with the plan and demonstrated an understanding of the instructions.   The patient was advised to call back or seek an in-person evaluation if the symptoms worsen or if the condition fails to improve as anticipated.  Penni Homans, MD [facility] [place] (phone) [phone] (fax)  [place]

## 2022-12-04 NOTE — Assessment & Plan Note (Signed)
Has lost 3 more #

## 2022-12-15 ENCOUNTER — Encounter: Payer: Self-pay | Admitting: Family Medicine

## 2022-12-18 ENCOUNTER — Other Ambulatory Visit: Payer: Self-pay

## 2022-12-18 DIAGNOSIS — E782 Mixed hyperlipidemia: Secondary | ICD-10-CM

## 2022-12-18 NOTE — Telephone Encounter (Signed)
Called pt and she on the way out of town  Labs ordered, Pt said she will do labs when she return.

## 2022-12-27 ENCOUNTER — Telehealth: Payer: Self-pay | Admitting: Family Medicine

## 2022-12-27 ENCOUNTER — Other Ambulatory Visit: Payer: Self-pay

## 2022-12-27 MED ORDER — PROGESTERONE POWD: 1 refills | Status: DC

## 2022-12-27 NOTE — Telephone Encounter (Signed)
Medication sent.

## 2022-12-27 NOTE — Telephone Encounter (Signed)
Prescription Request  12/27/2022  Is this a "Controlled Substance" medicine? No  LOV: 08/31/2022  What is the name of the medication or equipment?   Progesterone POWD [[id number]]   Have you contacted your pharmacy to request a refill? No   Which pharmacy would you like this sent to?   [facility]  [address] , [place] [phone]   Patient notified that their request is being sent to the clinical staff for review and that they should receive a response within 2 business days.   Please advise at Mobile [phone] (mobile)

## 2023-01-01 ENCOUNTER — Other Ambulatory Visit (INDEPENDENT_AMBULATORY_CARE_PROVIDER_SITE_OTHER): Payer: Medicare Other

## 2023-01-01 DIAGNOSIS — I10 Essential (primary) hypertension: Secondary | ICD-10-CM

## 2023-01-01 DIAGNOSIS — R739 Hyperglycemia, unspecified: Secondary | ICD-10-CM

## 2023-01-01 DIAGNOSIS — E782 Mixed hyperlipidemia: Secondary | ICD-10-CM | POA: Diagnosis not present

## 2023-01-01 DIAGNOSIS — E559 Vitamin D deficiency, unspecified: Secondary | ICD-10-CM | POA: Diagnosis not present

## 2023-01-01 LAB — HEMOGLOBIN A1C: Hgb A1c MFr Bld: 5.4 % (ref 4.6–6.5)

## 2023-01-01 LAB — LIPID PANEL
Cholesterol: 211 mg/dL — ABNORMAL HIGH (ref 0–200)
HDL: 69.9 mg/dL (ref >39.00)
NonHDL: 140.94
Total CHOL/HDL Ratio: 3
Triglycerides: 206 mg/dL — ABNORMAL HIGH (ref 0.0–149.0)
VLDL: 41.2 mg/dL — ABNORMAL HIGH (ref 0.0–40.0)

## 2023-01-01 LAB — CBC WITH DIFFERENTIAL/PLATELET
Basophils Absolute: 0 10*3/uL (ref 0.0–0.1)
Basophils Relative: 0.8 % (ref 0.0–3.0)
Eosinophils Absolute: 0.1 10*3/uL (ref 0.0–0.7)
Eosinophils Relative: 1.6 % (ref 0.0–5.0)
HCT: 38.9 % (ref 36.0–46.0)
Hemoglobin: 13.6 g/dL (ref 12.0–15.0)
Lymphocytes Relative: 39.9 % (ref 12.0–46.0)
Lymphs Abs: 2 10*3/uL (ref 0.7–4.0)
MCHC: 34.9 g/dL (ref 30.0–36.0)
MCV: 89.7 fl (ref 78.0–100.0)
Monocytes Absolute: 0.5 10*3/uL (ref 0.1–1.0)
Monocytes Relative: 9.9 % (ref 3.0–12.0)
Neutro Abs: 2.4 10*3/uL (ref 1.4–7.7)
Neutrophils Relative %: 47.8 % (ref 43.0–77.0)
Platelets: 279 10*3/uL (ref 150.0–400.0)
RBC: 4.34 Mil/uL (ref 3.87–5.11)
RDW: 13.6 % (ref 11.5–15.5)
WBC: 5 10*3/uL (ref 4.0–10.5)

## 2023-01-01 LAB — COMPREHENSIVE METABOLIC PANEL
ALT: 15 U/L (ref 0–35)
AST: 20 U/L (ref 0–37)
Albumin: 4.3 g/dL (ref 3.5–5.2)
Alkaline Phosphatase: 41 U/L (ref 39–117)
BUN: 12 mg/dL (ref 6–23)
CO2: 27 mEq/L (ref 19–32)
Calcium: 9.7 mg/dL (ref 8.4–10.5)
Chloride: 103 mEq/L (ref 96–112)
Creatinine, Ser: 0.54 mg/dL (ref 0.40–1.20)
GFR: 95.36 mL/min (ref >60.00)
Glucose, Bld: 87 mg/dL (ref 70–99)
Potassium: 4.4 mEq/L (ref 3.5–5.1)
Sodium: 138 mEq/L (ref 135–145)
Total Bilirubin: 0.6 mg/dL (ref 0.2–1.2)
Total Protein: 6.5 g/dL (ref 6.0–8.3)

## 2023-01-01 LAB — LDL CHOLESTEROL, DIRECT: Direct LDL: 124 mg/dL

## 2023-01-01 LAB — TSH: TSH: 0.81 u[IU]/mL (ref 0.35–5.50)

## 2023-01-01 LAB — VITAMIN D 25 HYDROXY (VIT D DEFICIENCY, FRACTURES): VITD: 72.32 ng/mL (ref 30.00–100.00)

## 2023-01-21 NOTE — Assessment & Plan Note (Signed)
hgba1c acceptable, minimize simple carbs. Increase exercise as tolerated.  

## 2023-01-21 NOTE — Assessment & Plan Note (Signed)
Well controlled, no changes to meds. Encouraged heart healthy diet such as the DASH diet and exercise as tolerated.

## 2023-01-21 NOTE — Assessment & Plan Note (Signed)
Encourage heart healthy diet such as MIND or DASH diet, increase exercise, avoid trans fats, simple carbohydrates and processed foods, consider a krill or fish or flaxseed oil cap daily.  °

## 2023-01-21 NOTE — Assessment & Plan Note (Signed)
Supplement and monitor 

## 2023-01-21 NOTE — Assessment & Plan Note (Signed)
Encouraged to get adequate exercise, calcium and vitamin d intake next Dexa July 2024

## 2023-01-22 ENCOUNTER — Telehealth (INDEPENDENT_AMBULATORY_CARE_PROVIDER_SITE_OTHER): Payer: Medicare Other | Admitting: Family Medicine

## 2023-01-22 ENCOUNTER — Encounter: Payer: Self-pay | Admitting: Family Medicine

## 2023-01-22 DIAGNOSIS — R739 Hyperglycemia, unspecified: Secondary | ICD-10-CM | POA: Diagnosis not present

## 2023-01-22 DIAGNOSIS — E782 Mixed hyperlipidemia: Secondary | ICD-10-CM

## 2023-01-22 DIAGNOSIS — E559 Vitamin D deficiency, unspecified: Secondary | ICD-10-CM | POA: Diagnosis not present

## 2023-01-22 DIAGNOSIS — I10 Essential (primary) hypertension: Secondary | ICD-10-CM

## 2023-01-22 DIAGNOSIS — D1722 Benign lipomatous neoplasm of skin and subcutaneous tissue of left arm: Secondary | ICD-10-CM

## 2023-01-22 DIAGNOSIS — M8580 Other specified disorders of bone density and structure, unspecified site: Secondary | ICD-10-CM | POA: Diagnosis not present

## 2023-01-22 DIAGNOSIS — D1720 Benign lipomatous neoplasm of skin and subcutaneous tissue of unspecified limb: Secondary | ICD-10-CM | POA: Insufficient documentation

## 2023-01-22 DIAGNOSIS — R1013 Epigastric pain: Secondary | ICD-10-CM | POA: Diagnosis not present

## 2023-01-22 NOTE — Progress Notes (Signed)
Virtual telephone visit    Virtual Visit via Telephone Note   This visit type was conducted due to national recommendations for restrictions regarding the COVID-19 Pandemic (e.g. social distancing) in an effort to limit this patient's exposure and mitigate transmission in our community. Due to her co-morbid illnesses, this patient is at least at moderate risk for complications without adequate follow up. This format is felt to be most appropriate for this patient at this time. The patient did not have access to video technology or had technical difficulties with video requiring transitioning to audio format only (telephone). Physical exam was limited to content and character of the telephone converstion. Shamaine CMA was able to get the patient set up on a telephone visit.   Patient location: home. Patient and provider in visit. Provider location: Office  I discussed the limitations of evaluation and management by telemedicine and the availability of in person appointments. The patient expressed understanding and agreed to proceed.   Visit Date: 01/22/2023  Today's healthcare provider: Danise Edge, MD     Subjective:    Patient ID: Megan Shah, female    DOB: 1956/05/07, 67 y.o.   MRN: [id number]  Chief Complaint  Patient presents with   Follow-up    Follow up    HPI Patient is in today for a follow-up visit.  Hypothyroidism Treated with Armour Thyroid 60 mg 5 days weekly, 30 mg two days weekly. Reports feeling excessively cold recently and wonders if this is related to thyroid function. TSH at 0.81 on 01/01/23.  Food Sensitivities Interested in allergy testing. Concerned that she may be allergic to certain grains due to related constipation. Plans to start eating prunes.  Hypertension Treated with losartan 50 mg in the morning and at bedtime. Blood pressures measured at home are as follows: 128/87, 152/84, 142/94, 138/89, 141/83, 136/87. Blood pressure higher  at night. Watching her sodium intake. Denies chest pain, palpitations.  Abdominal Bloating Reports some epigastric discomfort recently. Describes feeling as indigestion. Can get some relief by burping.  Bump Left Forearm Midshaft Notes soft bump on left forearm that has been there for years.  Vaccine counseling: she will check into her tetanus update status. Discussed shingles, RSV vaccines.  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   Bunion of great toe of right foot 02/07/2016   Diverticulitis    History of chicken pox 06/07/2015   Hyperlipidemia    Hypertension    Left shoulder pain 12/25/2016   Overweight 06/07/2015   Palpitations 06/07/2015   Patient is Jehovah's Witness 06/07/2015   Post menopausal syndrome 06/13/2015   Preventative health care 12/25/2016   Right hip pain 06/13/2015   Thyroid disease    Transfusion of blood product refused for religious reason 06/07/2015   Vitamin D deficiency 06/07/2015    Past Surgical History:  Procedure Laterality Date   PARTIAL HIP ARTHROPLASTY     WISDOM TOOTH EXTRACTION  Age 60    Family History  Problem Relation Age of Onset   Arrhythmia Mother    Arthritis Mother    Hypertension Mother    Hyperlipidemia Mother    Heart disease Mother        CAD with stents, afib   Atrial fibrillation Mother        flutter   Arrhythmia Father    Arthritis Father        broken hip, sepsis    Cancer Father  colon cancer, mirkel cell carcinoma   Bunion Father    Dementia Father    Atrial fibrillation Father    Cancer Brother        kidney   Colon polyps Brother        non cancer polyp   Cancer Brother        lymphoma in remission   Atrial fibrillation Brother    Arthritis Maternal Grandmother    Arthritis Maternal Grandfather    Cancer Maternal Grandfather 48       colon cancer   Arthritis Paternal Grandmother    Arthritis Paternal Grandfather    Cancer Paternal Grandfather        colon cancer     Social History   Socioeconomic History   Marital status: Married    Spouse name: Not on file   Number of children: 0   Years of education: Not on file   Highest education level: Not on file  Occupational History   Occupation: Agricultural consultant   Occupation: Retired Education officer, environmental business  Tobacco Use   Smoking status: Former   Smokeless tobacco: Never   Tobacco comments:    Smoked for 2 years as a teenager.  Vaping Use   Vaping Use: Never used  Substance and Sexual Activity   Alcohol use: Yes    Alcohol/week: 0.0 standard drinks of alcohol   Drug use: No   Sexual activity: Yes    Comment: lives with husband, moved from [place], no dietary restrictions, minimizes wheat and dairy, volunteer work  Other Topics Concern   Not on file  Social History Narrative   Not on file   Social Determinants of Health   Financial Resource Strain: Not on file  Food Insecurity: Not on file  Transportation Needs: Not on file  Physical Activity: Not on file  Stress: Not on file  Social Connections: Not on file  Intimate Partner Violence: Not on file    Outpatient Medications Prior to Visit  Medication Sig Dispense Refill   betamethasone dipropionate 0.05 % lotion Apply topically 2 (two) times daily. 60 mL 0   Cholecalciferol 50 MCG (2000 UT) TABS Take 4,000 Units by mouth 2 (two) times daily.      conjugated estrogens (PREMARIN) vaginal cream Place 1 Applicatorful vaginally daily. 42.5 g 11   DHEA 10 MG CAPS Take by mouth daily.     losartan (COZAAR) 50 MG tablet Take 1 tablet (50 mg total) by mouth in the morning and at bedtime. 180 tablet 1   Magnesium Citrate POWD Take 615 mg by mouth 2 (two) times daily.     Multiple Vitamins-Minerals (MULTIVITAMIN ADULT PO) Take 1 tablet by mouth daily.     nitroGLYCERIN (NITROSTAT) 0.4 MG SL tablet Place 1 tablet (0.4 mg total) under the tongue every 5 (five) minutes as needed for chest pain. 25 tablet 1   Omega-3 Fatty Acids (FISH OIL PO) Take by mouth.      Progesterone POWD Progesterone 112.5mg - Take 2 capsules by mouth at bedtime 180 g 1   thyroid (ARMOUR THYROID) 60 MG tablet TAKE ONE TABLET BY MOUTH BEFORE MEAL 5 DAYS WEEKLY, TAKE 1/2 TABLET BY MOUTH BEFORE MEAL THE OTHER 2 DAYS 90 tablet 1   TURMERIC PO Take by mouth.     No facility-administered medications prior to visit.    No Known Allergies  Review of Systems  Constitutional:  Negative for fever and malaise/fatigue.  HENT:  Negative for congestion.   Eyes:  Negative for blurred  vision.  Respiratory:  Negative for cough and shortness of breath.   Cardiovascular:  Negative for chest pain, palpitations and leg swelling.  Gastrointestinal:  Positive for abdominal pain (Epigastric bloating) and constipation. Negative for vomiting.  Musculoskeletal:  Negative for back pain.  Skin:  Negative for rash.       (+) Cold intolerance (+) Bump on left forearm  Neurological:  Negative for loss of consciousness and headaches.       Objective:    Physical Exam Constitutional:      General: She is not in acute distress.    Appearance: Normal appearance. She is well-developed. She is not ill-appearing.  HENT:     Head: Normocephalic and atraumatic.  Eyes:     General: Lids are normal.  Pulmonary:     Effort: Pulmonary effort is normal.  Neurological:     Mental Status: She is alert and oriented to person, place, and time.  Psychiatric:        Attention and Perception: Attention and perception normal.        Judgment: Judgment normal.     There were no vitals taken for this visit. Wt Readings from Last 3 Encounters:  12/04/22 165 lb (74.8 kg)  08/31/22 168 lb 9.6 oz (76.5 kg)  07/14/22 176 lb (79.8 kg)       Assessment & Plan:  Hyperglycemia Assessment & Plan: hgba1c acceptable, minimize simple carbs. Increase exercise as tolerated.    Mixed hyperlipidemia Assessment & Plan: Encourage heart healthy diet such as MIND or DASH diet, increase exercise, avoid trans fats,  simple carbohydrates and processed foods, consider a krill or fish or flaxseed oil cap daily.     Primary hypertension Assessment & Plan: Well controlled often but still seeing some systolic numbers in the 140s, no changes to meds. Encouraged heart healthy diet such as the DASH diet and exercise as tolerated. Feeling well she will report persistent concerns.   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 No erythema or pain. Likely a lipoma she will report if it grows or changes rapidly   Dyspepsia Assessment & Plan: She notes some epigastic bloating and discomfort.. belching provides some relief. Avoid offending foods and consider protiobics and fiber.      I discussed the assessment and treatment plan with the patient. The patient was provided an opportunity to ask questions and all were answered. The patient agreed with the plan and demonstrated an understanding of the instructions.   The patient was advised to call back or seek an in-person evaluation if the symptoms worsen or if the condition fails to improve as anticipated.    I,Alexander Ruley,acting as a scribe for Danise Edge, MD.,have documented all relevant documentation on the behalf of Danise Edge, MD,as directed by  Danise Edge, MD while in the presence of Danise Edge, MD.    Danise Edge, MD [facility] [phone] (phone) [phone] (fax)  [facility]

## 2023-01-22 NOTE — Patient Instructions (Addendum)
.sb  Nonallergic Rhinitis Nonallergic rhinitis is inflammation of the mucous membrane inside the nose. The mucous membrane is the tissue that produces mucus. This condition is different from having allergic rhinitis, which is an allergy that affects the nose. Allergic rhinitis occurs when the body's defense system, or immune system, reacts to a substance that a person is allergic to (allergen), such as pollen, pet dander, mold, or dust. Nonallergic rhinitis has many similar symptoms, but it is not caused by allergens. Nonallergic rhinitis can be an acute or chronic problem. This means it can be short-term or long-term. What are the causes? This condition may be caused by many different things. Some common types of nonallergic rhinitis include: Infectious rhinitis. This is usually caused by an infection in the nose, throat, or upper airways (upper respiratory system). Vasomotor rhinitis. This is the most common type. It is caused by too much blood flow through your nose, and makes your nose swell. It is triggered by strong odors, cold air, stress, drinking alcohol, cigarette smoke, or changes in the weather. Occupational rhinitis. This type is caused by triggers in the workplace, such as chemicals, dust, animal dander, or air pollution. Hormonal rhinitis, in female teens and adults. This type is caused by an increase in the hormone estrogen and may happen during pregnancy, puberty, or monthly menstrual periods. Hormonal rhinitis gives you fewer symptoms when estrogen levels drop. Drug-induced rhinitis. Several types of medicines can cause this, such as medicines for high blood pressure or heart disease, aspirin, or NSAIDs. Nonallergic rhinitis with eosinophilia syndrome (NARES). This type is caused by having too much eosinophil, a type of white blood cell. Other causes include a reaction to eating hot or spicy foods. This does not usually cause long-term symptoms. In some cases, the cause of  nonallergic rhinitis is not known. What increases the risk? You are more likely to develop this condition if: You are 8-4 years of age. You are female. People who are female are twice as likely to have this condition. What are the signs or symptoms? Common symptoms of this condition include: Stuffy nose (nasal congestion). Runny nose. A feeling of mucus dripping down the back of your throat (postnasal drip). Trouble sleeping. Tiredness, or fatigue. Other symptoms include: Sneezing. Coughing. Itchy nose. Bloodshot eyes. How is this diagnosed? This type may be diagnosed based on: Your symptoms and medical history. A physical exam. Allergy testing to rule out allergic rhinitis. You may have skin tests or blood tests. Your health care provider may also take a swab of nasal discharge to look for an increased number of eosinophils. This is done to confirm a diagnosis of NARES. How is this treated? Treatment for this condition depends on the cause. No single treatment works for everyone. Work with your provider to find the best treatment for you. Treatment may include: Avoiding the things that trigger your symptoms. Medicines to relieve congestion, such as: Steroid nasal spray. There are many types. You may need to try a few to find out which one works best. Engineer, civil (consulting) medicine. This treats nasal congestion and may be given by mouth or as a nasal spray. These medicines are used only for a short time. Medicines to relieve a runny nose. These may include antihistamine medicines or decongestant nasal sprays. Nasal irrigation. This involves using a salt-water (saline) spray or saline container called a neti pot. Nasal irrigation helps to clear away mucus and keep your nasal passages moist. Surgery to remove part of your mucous membrane. This is  done in severe cases if the condition has not improved after 6-12 months of treatment. Follow these instructions at home: Medicines Take or use  over-the-counter and prescription medicines only as told by your provider. Do not stop using your medicine even if you start to feel better. Do not take NSAIDs, such as ibuprofen, or medicines that contain aspirin if they make your symptoms worse. Lifestyle Do not drink alcohol if it makes your symptoms worse. Do not use any products that contain nicotine or tobacco. These products include cigarettes, chewing tobacco, and vaping devices, such as e-cigarettes. If you need help quitting, ask your provider. Avoid secondhand smoke. General instructions Avoid triggers that make your symptoms worse. Use nasal irrigation as told by your provider. Sleep with the head of your bed raised. This may reduce nasal congestion when you sleep. Drink enough fluid to keep your pee (urine) pale yellow. Contact a health care provider if: You have a fever. Your symptoms are getting worse at home. Your symptoms do not lessen with medicine. You develop new symptoms, especially a headache or nosebleed. Get help right away if: You have difficulty breathing. This symptom may be an emergency. Get help right away. Call 911. Do not wait to see if the symptoms will go away. Do not drive yourself to the hospital. This information is not intended to replace advice given to you by your health care provider. Make sure you discuss any questions you have with your health care provider. Document Revised: 04/25/2022 Document Reviewed: 04/25/2022 Elsevier Patient Education  2023 [employer].

## 2023-01-22 NOTE — Assessment & Plan Note (Signed)
Soft, mobile and present for years. No erythema or pain. Likely a lipoma she will report if it grows or changes rapidly

## 2023-01-22 NOTE — Assessment & Plan Note (Signed)
She notes some epigastic bloating and discomfort.. belching provides some relief. Avoid offending foods and consider protiobics and fiber.

## 2023-01-23 ENCOUNTER — Telehealth: Payer: Self-pay

## 2023-01-23 NOTE — Telephone Encounter (Signed)
Called pt lvm needed to get her setup for  2-3 month follow up app and sent mychart .

## 2023-02-02 ENCOUNTER — Telehealth: Payer: Self-pay

## 2023-02-02 ENCOUNTER — Encounter: Payer: Self-pay | Admitting: Family Medicine

## 2023-02-02 NOTE — Telephone Encounter (Signed)
Called pt advised needing be seen and pt agreed to go Urgent Care.

## 2023-02-02 NOTE — Telephone Encounter (Signed)
Called pt regarding Tick bite was advised needing to be seen and we don't have any opening. Pt advised to go Urgent Care and pt agree she would go.

## 2023-02-08 ENCOUNTER — Encounter: Payer: Self-pay | Admitting: Family Medicine

## 2023-02-08 MED ORDER — THYROID 60 MG PO TABS: ORAL_TABLET | ORAL | 0 refills | Status: DC

## 2023-03-12 ENCOUNTER — Ambulatory Visit (INDEPENDENT_AMBULATORY_CARE_PROVIDER_SITE_OTHER): Payer: Medicare Other | Admitting: *Deleted

## 2023-03-12 ENCOUNTER — Encounter: Payer: Self-pay | Admitting: Family Medicine

## 2023-03-12 ENCOUNTER — Other Ambulatory Visit: Payer: Self-pay | Admitting: Family Medicine

## 2023-03-12 VITALS — Ht 66.0 in | Wt 160.0 lb

## 2023-03-12 DIAGNOSIS — Z0000 Encounter for general adult medical examination without abnormal findings: Secondary | ICD-10-CM

## 2023-03-12 DIAGNOSIS — T781XXA Other adverse food reactions, not elsewhere classified, initial encounter: Secondary | ICD-10-CM

## 2023-03-12 NOTE — Patient Instructions (Signed)
Megan Shah , Thank you for taking time to come for your Medicare Wellness Visit. I appreciate your ongoing commitment to your health goals. Please review the following plan we discussed and let me know if I can assist you in the future.   These are the goals we discussed:  Goals   None     This is a list of the screening recommended for you and due dates:  Health Maintenance  Topic Date Due   DTaP/Tdap/Td vaccine (1 - Tdap) Never done   Zoster (Shingles) Vaccine (1 of 2) Never done   Pneumonia Vaccine (1 of 1 - PCV) Never done   DEXA scan (bone density measurement)  Never done   COVID-19 Vaccine (4 - 2023-24 season) 05/05/2022   Flu Shot  04/05/2023   Colon Cancer Screening  09/05/2023   Medicare Annual Wellness Visit  03/11/2024   Mammogram  04/28/2024   Hepatitis C Screening  Completed   HPV Vaccine  Aged Out     Next appointment: Follow up in one year for your annual wellness visit.   Preventive Care 72 Years and Older, Female Preventive care refers to lifestyle choices and visits with your health care provider that can promote health and wellness. What does preventive care include? A yearly physical exam. This is also called an annual well check. Dental exams once or twice a year. Routine eye exams. Ask your health care provider how often you should have your eyes checked. Personal lifestyle choices, including: Daily care of your teeth and gums. Regular physical activity. Eating a healthy diet. Avoiding tobacco and drug use. Limiting alcohol use. Practicing safe sex. Taking low-dose aspirin every day. Taking vitamin and mineral supplements as recommended by your health care provider. What happens during an annual well check? The services and screenings done by your health care provider during your annual well check will depend on your age, overall health, lifestyle risk factors, and family history of disease. Counseling  Your health care provider may ask you  questions about your: Alcohol use. Tobacco use. Drug use. Emotional well-being. Home and relationship well-being. Sexual activity. Eating habits. History of falls. Memory and ability to understand (cognition). Work and work Astronomer. Reproductive health. Screening  You may have the following tests or measurements: Height, weight, and BMI. Blood pressure. Lipid and cholesterol levels. These may be checked every 5 years, or more frequently if you are over 22 years old. Skin check. Lung cancer screening. You may have this screening every year starting at age 89 if you have a 30-pack-year history of smoking and currently smoke or have quit within the past 15 years. Fecal occult blood test (FOBT) of the stool. You may have this test every year starting at age 59. Flexible sigmoidoscopy or colonoscopy. You may have a sigmoidoscopy every 5 years or a colonoscopy every 10 years starting at age 77. Hepatitis C blood test. Hepatitis B blood test. Sexually transmitted disease (STD) testing. Diabetes screening. This is done by checking your blood sugar (glucose) after you have not eaten for a while (fasting). You may have this done every 1-3 years. Bone density scan. This is done to screen for osteoporosis. You may have this done starting at age 43. Mammogram. This may be done every 1-2 years. Talk to your health care provider about how often you should have regular mammograms. Talk with your health care provider about your test results, treatment options, and if necessary, the need for more tests. Vaccines  Your health care  provider may recommend certain vaccines, such as: Influenza vaccine. This is recommended every year. Tetanus, diphtheria, and acellular pertussis (Tdap, Td) vaccine. You may need a Td booster every 10 years. Zoster vaccine. You may need this after age 18. Pneumococcal 13-valent conjugate (PCV13) vaccine. One dose is recommended after age 53. Pneumococcal polysaccharide  (PPSV23) vaccine. One dose is recommended after age 58. Talk to your health care provider about which screenings and vaccines you need and how often you need them. This information is not intended to replace advice given to you by your health care provider. Make sure you discuss any questions you have with your health care provider. Document Released: 09/17/2015 Document Revised: 05/10/2016 Document Reviewed: 06/22/2015 Elsevier Interactive Patient Education  2017 [employer].  Fall Prevention in the Home Falls can cause injuries. They can happen to people of all ages. There are many things you can do to make your home safe and to help prevent falls. What can I do on the outside of my home? Regularly fix the edges of walkways and driveways and fix any cracks. Remove anything that might make you trip as you walk through a door, such as a raised step or threshold. Trim any bushes or trees on the path to your home. Use bright outdoor lighting. Clear any walking paths of anything that might make someone trip, such as rocks or tools. Regularly check to see if handrails are loose or broken. Make sure that both sides of any steps have handrails. Any raised decks and porches should have guardrails on the edges. Have any leaves, snow, or ice cleared regularly. Use sand or salt on walking paths during winter. Clean up any spills in your garage right away. This includes oil or grease spills. What can I do in the bathroom? Use night lights. Install grab bars by the toilet and in the tub and shower. Do not use towel bars as grab bars. Use non-skid mats or decals in the tub or shower. If you need to sit down in the shower, use a plastic, non-slip stool. Keep the floor dry. Clean up any water that spills on the floor as soon as it happens. Remove soap buildup in the tub or shower regularly. Attach bath mats securely with double-sided non-slip rug tape. Do not have throw rugs and other things on the  floor that can make you trip. What can I do in the bedroom? Use night lights. Make sure that you have a light by your bed that is easy to reach. Do not use any sheets or blankets that are too big for your bed. They should not hang down onto the floor. Have a firm chair that has side arms. You can use this for support while you get dressed. Do not have throw rugs and other things on the floor that can make you trip. What can I do in the kitchen? Clean up any spills right away. Avoid walking on wet floors. Keep items that you use a lot in easy-to-reach places. If you need to reach something above you, use a strong step stool that has a grab bar. Keep electrical cords out of the way. Do not use floor polish or wax that makes floors slippery. If you must use wax, use non-skid floor wax. Do not have throw rugs and other things on the floor that can make you trip. What can I do with my stairs? Do not leave any items on the stairs. Make sure that there are handrails on both  sides of the stairs and use them. Fix handrails that are broken or loose. Make sure that handrails are as long as the stairways. Check any carpeting to make sure that it is firmly attached to the stairs. Fix any carpet that is loose or worn. Avoid having throw rugs at the top or bottom of the stairs. If you do have throw rugs, attach them to the floor with carpet tape. Make sure that you have a light switch at the top of the stairs and the bottom of the stairs. If you do not have them, ask someone to add them for you. What else can I do to help prevent falls? Wear shoes that: Do not have high heels. Have rubber bottoms. Are comfortable and fit you well. Are closed at the toe. Do not wear sandals. If you use a stepladder: Make sure that it is fully opened. Do not climb a closed stepladder. Make sure that both sides of the stepladder are locked into place. Ask someone to hold it for you, if possible. Clearly mark and make  sure that you can see: Any grab bars or handrails. First and last steps. Where the edge of each step is. Use tools that help you move around (mobility aids) if they are needed. These include: Canes. Walkers. Scooters. Crutches. Turn on the lights when you go into a dark area. Replace any light bulbs as soon as they burn out. Set up your furniture so you have a clear path. Avoid moving your furniture around. If any of your floors are uneven, fix them. If there are any pets around you, be aware of where they are. Review your medicines with your doctor. Some medicines can make you feel dizzy. This can increase your chance of falling. Ask your doctor what other things that you can do to help prevent falls. This information is not intended to replace advice given to you by your health care provider. Make sure you discuss any questions you have with your health care provider. Document Released: 06/17/2009 Document Revised: 01/27/2016 Document Reviewed: 09/25/2014 Elsevier Interactive Patient Education  2017 [employer].

## 2023-03-12 NOTE — Progress Notes (Signed)
Subjective:   Megan Megan Shah is a 67 y.o. female who presents for an Initial Medicare Annual Wellness Visit.  Visit Complete: Virtual  I connected with  Megan Shah on 03/12/23 by a audio enabled telemedicine application and verified that I am speaking with the correct person using two identifiers.  Patient Location: Home  Provider Location: Office/Clinic  I discussed the limitations of evaluation and management by telemedicine. The patient expressed understanding and agreed to proceed.   Review of Systems     Cardiac Risk Factors include: advanced age (>48men, >13 women);dyslipidemia;hypertension     Objective:    Today's Vitals   03/12/23 0842  Weight: 160 lb (72.6 kg)  Height: 5\' 6"  (1.676 m)   Body mass index is 25.82 kg/m.     03/12/2023    8:11 AM 03/07/2022   10:27 AM 03/05/2022    1:51 PM  Advanced Directives  Does Patient Have a Medical Advance Directive? Yes No No  Type of Estate agent of [place];Living will    Does patient want to make changes to medical advance directive? No - Patient declined    Copy of Healthcare Power of Attorney in Chart? Yes - validated most recent copy scanned in chart (See row information)    Would patient like information on creating a medical advance directive?  No - Patient declined     Current Medications (verified) Outpatient Encounter Medications as of 03/12/2023  Medication Sig   betamethasone dipropionate 0.05 % lotion Apply topically 2 (two) times daily.   Cholecalciferol 50 MCG (2000 UT) TABS Take 4,000 Units by mouth 2 (two) times daily.    conjugated estrogens (PREMARIN) vaginal cream Place 1 Applicatorful vaginally daily.   DHEA 10 MG CAPS Take by mouth daily.   losartan (COZAAR) 50 MG tablet Take 1 tablet (50 mg total) by mouth in the morning and at bedtime.   Magnesium Citrate POWD Take 615 mg by mouth 2 (two) times daily.   Multiple Vitamins-Minerals (MULTIVITAMIN ADULT PO) Take 1  tablet by mouth daily.   nitroGLYCERIN (NITROSTAT) 0.4 MG SL tablet Place 1 tablet (0.4 mg total) under the tongue every 5 (five) minutes as needed for chest pain.   Omega-3 Fatty Acids (FISH OIL PO) Take by mouth.   Progesterone POWD Progesterone 112.5mg - Take 2 capsules by mouth at bedtime   thyroid (ARMOUR THYROID) 60 MG tablet TAKE ONE TABLET BY MOUTH BEFORE MEAL 5 DAYS WEEKLY, TAKE 1/2 TABLET BY MOUTH BEFORE MEAL THE OTHER 2 DAYS   TURMERIC PO Take by mouth.   No facility-administered encounter medications on file as of 03/12/2023.    Allergies (verified) Patient has no known allergies.   History: 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   Bunion of great toe of right foot 02/07/2016   Diverticulitis    History of chicken pox 06/07/2015   Hyperlipidemia    Hypertension    Left shoulder pain 12/25/2016   Overweight 06/07/2015   Palpitations 06/07/2015   Patient is Jehovah's Witness 06/07/2015   Post menopausal syndrome 06/13/2015   Preventative health care 12/25/2016   Right hip pain 06/13/2015   Thyroid disease    Transfusion of blood product refused for religious reason 06/07/2015   Vitamin D deficiency 06/07/2015   Past Surgical History:  Procedure Laterality Date   PARTIAL HIP ARTHROPLASTY     WISDOM TOOTH EXTRACTION  Age 61   Family History  Problem Relation Age of Onset  Arrhythmia Mother    Arthritis Mother    Hypertension Mother    Hyperlipidemia Mother    Heart disease Mother        CAD with stents, afib   Atrial fibrillation Mother        flutter   Arrhythmia Father    Arthritis Father        broken hip, sepsis    Cancer Father        colon cancer, mirkel cell carcinoma   Bunion Father    Dementia Father    Atrial fibrillation Father    Cancer Brother        kidney   Colon polyps Brother        non cancer polyp   Cancer Brother        lymphoma in remission   Atrial fibrillation Brother    Arthritis Maternal Grandmother     Arthritis Maternal Grandfather    Cancer Maternal Grandfather 42       colon cancer   Arthritis Paternal Grandmother    Arthritis Paternal Grandfather    Cancer Paternal Grandfather        colon cancer   Social History   Socioeconomic History   Marital status: Married    Spouse name: Not on file   Number of children: 0   Years of education: Not on file   Highest education level: Not on file  Occupational History   Occupation: Agricultural consultant   Occupation: Retired Education officer, environmental business  Tobacco Use   Smoking status: Former   Smokeless tobacco: Never   Tobacco comments:    Smoked for 2 years as a teenager.  Vaping Use   Vaping Use: Never used  Substance and Sexual Activity   Alcohol use: Yes    Alcohol/week: 0.0 standard drinks of alcohol   Drug use: No   Sexual activity: Yes    Comment: lives with husband, moved from [place], no dietary restrictions, minimizes wheat and dairy, volunteer work  Other Topics Concern   Not on file  Social History Narrative   Not on file   Social Determinants of Health   Financial Resource Strain: Low Risk  (03/12/2023)   Overall Financial Resource Strain (CARDIA)    Difficulty of Paying Living Expenses: Not hard at all  Food Insecurity: No Food Insecurity (03/12/2023)   Hunger Vital Sign    Worried About Running Out of Food in the Last Year: Never true    Ran Out of Food in the Last Year: Never true  Transportation Needs: No Transportation Needs (03/12/2023)   PRAPARE - Administrator, Civil Service (Medical): No    Lack of Transportation (Non-Medical): No  Physical Activity: Insufficiently Active (03/12/2023)   Exercise Vital Sign    Days of Exercise per Week: 3 days    Minutes of Exercise per Session: 30 min  Stress: No Stress Concern Present (03/12/2023)   [employer] of Occupational Health - Occupational Stress Questionnaire    Feeling of Stress : Only a little  Social Connections: Unknown (03/12/2023)   Social Connection and  Isolation Panel [NHANES]    Frequency of Communication with Friends and Family: More than three times a week    Frequency of Social Gatherings with Friends and Family: More than three times a week    Attends Religious Services: Not on file    Active Member of Clubs or Organizations: Yes    Attends Banker Meetings: More than 4 times per year  Marital Status: Married    Tobacco Counseling Counseling given: Not Answered Tobacco comments: Smoked for 2 years as a teenager.   Clinical Intake:  Pre-visit preparation completed: Yes  Pain : No/denies pain  BMI - recorded: 25.82 Nutritional Status: BMI 25 -29 Overweight Nutritional Risks: None Diabetes: No  How often do you need to have someone help you when you read instructions, pamphlets, or other written materials from your doctor or pharmacy?: 1 - Never  Interpreter Needed?: No  Information entered by :: Donne Anon, CMA   Activities of Daily Living    03/12/2023    8:03 AM  In your present state of health, do you have any difficulty performing the following activities:  Hearing? 1  Comment slight hearing loss  Vision? 0  Difficulty concentrating or making decisions? 0  Walking or climbing stairs? 0  Dressing or bathing? 0  Doing errands, shopping? 0  Preparing Food and eating ? N  Using the Toilet? N  In the past six months, have you accidently leaked urine? N  Do you have problems with loss of bowel control? N  Managing your Medications? N  Managing your Finances? N  Housekeeping or managing your Housekeeping? N    Patient Care Team: Bradd Canary, MD as PCP - General (Family Medicine) Jeanann Lewandowsky, MD as Referring Physician (Family Medicine)  Indicate any recent Medical Services you may have received from other than [facility] providers in the past year (date may be approximate).     Assessment:   This is a routine wellness examination for Chabeli Zeolla.  Hearing/Vision screen No results  found.  Dietary issues and exercise activities discussed:     Goals Addressed   None    Depression Screen    03/12/2023    8:33 AM 08/31/2022    2:17 PM 07/14/2022    3:04 PM 04/24/2022    1:59 PM 02/23/2022    8:23 AM  PHQ 2/9 Scores  PHQ - 2 Score 0 1 0 0 0    Fall Risk    03/12/2023    8:03 AM 07/14/2022    3:04 PM 04/24/2022    1:59 PM 02/23/2022    8:19 AM  Fall Risk   Falls in the past year? 0 0 0 0  Number falls in past yr: 0 0 0 0  Injury with Fall? 0 0 0 0  Risk for fall due to : No Fall Risks No Fall Risks  No Fall Risks  Follow up Falls evaluation completed Falls evaluation completed Falls evaluation completed Falls evaluation completed    MEDICARE RISK AT HOME:   TIMED UP AND GO:  Was the test performed? No    Cognitive Function:        03/12/2023    8:35 AM  6CIT Screen  What Year? 0 points  What month? 0 points  What time? 0 points  Count back from 20 0 points  Months in reverse 0 points  Repeat phrase 0 points  Total Score 0 points    Immunizations Immunization History  Administered Date(s) Administered   Hepatitis A, Adult 12/21/2014   Hepatitis B, ADULT 12/21/2014, 02/02/2015   PFIZER(Purple Top)SARS-COV-2 Vaccination 02/07/2020, 02/28/2020   Pfizer Covid-19 Vaccine Bivalent Booster 92yrs & up 10/21/2021    TDAP status: Due, Education has been provided regarding the importance of this vaccine. Advised may receive this vaccine at local pharmacy or Health Dept. Aware to provide a copy of the vaccination record if obtained from  local pharmacy or Health Dept. Verbalized acceptance and understanding.  Flu Vaccine status: Up to date  Pneumococcal vaccine status: Due, Education has been provided regarding the importance of this vaccine. Advised may receive this vaccine at local pharmacy or Health Dept. Aware to provide a copy of the vaccination record if obtained from local pharmacy or Health Dept. Verbalized acceptance and  understanding.  Covid-19 vaccine status: Information provided on how to obtain vaccines.   Qualifies for Shingles Vaccine? Yes   Zostavax completed No   Shingrix Completed?: No.    Education has been provided regarding the importance of this vaccine. Patient has been advised to call insurance company to determine out of pocket expense if they have not yet received this vaccine. Advised may also receive vaccine at local pharmacy or Health Dept. Verbalized acceptance and understanding.  Screening Tests Health Maintenance  Topic Date Due   Medicare Annual Wellness (AWV)  Never done   DTaP/Tdap/Td (1 - Tdap) Never done   Zoster Vaccines- Shingrix (1 of 2) Never done   Pneumonia Vaccine 59+ Years old (1 of 1 - PCV) Never done   DEXA SCAN  Never done   COVID-19 Vaccine (4 - 2023-24 season) 05/05/2022   INFLUENZA VACCINE  04/05/2023   Colonoscopy  09/05/2023   MAMMOGRAM  04/28/2024   Hepatitis C Screening  Completed   HPV VACCINES  Aged Out    Health Maintenance  Health Maintenance Due  Topic Date Due   Medicare Annual Wellness (AWV)  Never done   DTaP/Tdap/Td (1 - Tdap) Never done   Zoster Vaccines- Shingrix (1 of 2) Never done   Pneumonia Vaccine 47+ Years old (1 of 1 - PCV) Never done   DEXA SCAN  Never done   COVID-19 Vaccine (4 - 2023-24 season) 05/05/2022    Colorectal cancer screening: Type of screening: Colonoscopy. Completed 09/04/13. Repeat every 10 years  Mammogram status: Completed 04/28/22. Repeat every year  Bone Density status: Ordered 12/04/22. Pt provided with contact info and advised to call to schedule appt.  Lung Cancer Screening: (Low Dose CT Chest recommended if Age 45-80 years, 20 pack-year currently smoking OR have quit w/in 15years.) does not qualify.   Additional Screening:  Hepatitis C Screening: does qualify; Completed 12/21/14  Vision Screening: Recommended annual ophthalmology exams for early detection of glaucoma and other disorders of the eye. Is  the patient up to date with their annual eye exam?  Yes  Who is the provider or what is the name of the office in which the patient attends annual eye exams? [facility] If pt is not established with a provider, would they like to be referred to a provider to establish care? No .   Dental Screening: Recommended annual dental exams for proper oral hygiene  Diabetic Foot Exam: N/a  Community Resource Referral / Chronic Care Management: CRR required this visit?  No   CCM required this visit?  No     Plan:     I have personally reviewed and noted the following in the patient's chart:   Medical and social history Use of alcohol, tobacco or illicit drugs  Current medications and supplements including opioid prescriptions. Patient is not currently taking opioid prescriptions. Functional ability and status Nutritional status Physical activity Advanced directives List of other physicians Hospitalizations, surgeries, and ER visits in previous 12 months Vitals Screenings to include cognitive, depression, and falls Referrals and appointments  In addition, I have reviewed and discussed with patient certain preventive protocols,  quality metrics, and best practice recommendations. A written personalized care plan for preventive services as well as general preventive health recommendations were provided to patient.     Donne Anon, CMA   03/12/2023   After Visit Summary: (MyChart) Due to this being a telephonic visit, the after visit summary with patients personalized plan was offered to patient via MyChart   Nurse Notes: None

## 2023-03-14 ENCOUNTER — Other Ambulatory Visit: Payer: Self-pay | Admitting: Family Medicine

## 2023-03-15 ENCOUNTER — Encounter: Payer: Self-pay | Admitting: Family Medicine

## 2023-04-04 ENCOUNTER — Encounter (INDEPENDENT_AMBULATORY_CARE_PROVIDER_SITE_OTHER): Payer: Self-pay

## 2023-04-16 ENCOUNTER — Ambulatory Visit (HOSPITAL_BASED_OUTPATIENT_CLINIC_OR_DEPARTMENT_OTHER)
Admission: RE | Admit: 2023-04-16 | Discharge: 2023-04-16 | Disposition: A | Discharge status: Home / Self Care | Payer: Medicare Other | Source: Ambulatory Visit | Attending: Family Medicine | Admitting: Family Medicine

## 2023-04-16 DIAGNOSIS — Z780 Asymptomatic menopausal state: Secondary | ICD-10-CM | POA: Diagnosis not present

## 2023-04-16 DIAGNOSIS — Z13820 Encounter for screening for osteoporosis: Secondary | ICD-10-CM | POA: Insufficient documentation

## 2023-04-16 DIAGNOSIS — Z87891 Personal history of nicotine dependence: Secondary | ICD-10-CM | POA: Diagnosis not present

## 2023-04-16 DIAGNOSIS — M8589 Other specified disorders of bone density and structure, multiple sites: Secondary | ICD-10-CM | POA: Insufficient documentation

## 2023-04-16 DIAGNOSIS — M8580 Other specified disorders of bone density and structure, unspecified site: Secondary | ICD-10-CM | POA: Insufficient documentation

## 2023-04-16 DIAGNOSIS — E039 Hypothyroidism, unspecified: Secondary | ICD-10-CM | POA: Diagnosis not present

## 2023-04-25 ENCOUNTER — Encounter: Payer: Self-pay | Admitting: Allergy

## 2023-04-25 ENCOUNTER — Other Ambulatory Visit: Payer: Self-pay

## 2023-04-25 ENCOUNTER — Ambulatory Visit (INDEPENDENT_AMBULATORY_CARE_PROVIDER_SITE_OTHER): Payer: Medicare Other | Admitting: Allergy

## 2023-04-25 VITALS — BP 150/88 | HR 68 | Temp 98.4°F | Resp 16 | Ht 65.0 in | Wt 166.0 lb

## 2023-04-25 DIAGNOSIS — K9049 Malabsorption due to intolerance, not elsewhere classified: Secondary | ICD-10-CM | POA: Diagnosis not present

## 2023-04-25 NOTE — Patient Instructions (Signed)
-   Increased symptoms of congestion following foods including dairy, gluten products.  Will assess between IgE mediated allergy versus food intolerance.  Will obtain serum IgE levels today I will call with these results.  If this testing is negative then we will recommend skin testing to confirm with no IgE presence. Further recommendations regarding avoidance and/or epinephrine need will be determined based on test results. - Did discuss today that dairy is known to thicken mucus thus can increase congestion which would not represent a direct food allergy. - Ear and scalp dermatitis is eczematous-like.  Continue as needed use of betamethasone as previous directed.   Follow-up pending results

## 2023-04-25 NOTE — Progress Notes (Signed)
New Patient Note  RE: Megan Shah MRN: [id number] DOB: Dec 19, 1955 Date of Office Visit: 04/25/2023  Primary care provider: Bradd Canary, MD  Chief Complaint: allergies  History of present illness: Fair Talani Wilmes is a 67 y.o. female presenting today for evaluation of food sensitivity.   She states she has purposely lost weight and she wants to ensure she is eating foods that are good for her to eat.  She states for years she has noted for example at [place] restaurants will notice congestion after eating and not sure if it is related to the bread or pasta or dairy.  When she drinks a beer (usually dark beer about 1-2 times a year) she also notes congestion.   She has flaking of left ear mostly and has scalp flakiness in back of scalp.  She does have betamethasone to use and she does apply to ear with qtip and uses sparingly on scalp.  She is not sure if this could be caused due to foods or not.  Denies having any GI, respiratory or CV related symptoms after food ingestion.  No history of food allergy.   No history of seasonal allergy and symptoms with seasons are very mild and does not warrant medications she reports.  No history of eczema or asthma.   Review of systems: 10pt ROS negative unless noted above in HPI  Past medical history: 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   Bunion of great toe of right foot 02/07/2016   Diverticulitis    History of chicken pox 06/07/2015   Hyperlipidemia    Hypertension    Left shoulder pain 12/25/2016   Overweight 06/07/2015   Palpitations 06/07/2015   Patient is Jehovah's Witness 06/07/2015   Post menopausal syndrome 06/13/2015   Preventative health care 12/25/2016   Right hip pain 06/13/2015   Thyroid disease    Transfusion of blood product refused for religious reason 06/07/2015   Vitamin D deficiency 06/07/2015    Past surgical history: Past Surgical History:  Procedure Laterality Date   PARTIAL  HIP ARTHROPLASTY     WISDOM TOOTH EXTRACTION  Age 19    Family history:  Family History  Problem Relation Age of Onset   Arrhythmia Mother    Arthritis Mother    Hypertension Mother    Hyperlipidemia Mother    Heart disease Mother        CAD with stents, afib   Atrial fibrillation Mother        flutter   Arrhythmia Father    Arthritis Father        broken hip, sepsis    Cancer Father        colon cancer, mirkel cell carcinoma   Bunion Father    Dementia Father    Atrial fibrillation Father    Cancer Brother        kidney   Colon polyps Brother        non cancer polyp   Cancer Brother        lymphoma in remission   Atrial fibrillation Brother    Arthritis Maternal Grandmother    Arthritis Maternal Grandfather    Cancer Maternal Grandfather 12       colon cancer   Arthritis Paternal Grandmother    Arthritis Paternal Grandfather    Cancer Paternal Grandfather        colon cancer    Social history: Lives in a home without carpeting with electric heating  and central cooling.  Cats in the home.  There is no concern for water damage, mildew or roaches in the home.  Does not work at this time.  Reports a smoking history from 1972-1975 1.5pk/d cigarettes.     Medication List: Current Outpatient Medications  Medication Sig Dispense Refill   betamethasone dipropionate 0.05 % lotion Apply topically 2 (two) times daily. 60 mL 0   Cholecalciferol 50 MCG (2000 UT) TABS Take 4,000 Units by mouth 2 (two) times daily.      DHEA 10 MG CAPS Take by mouth daily.     losartan (COZAAR) 50 MG tablet Take 1 tablet (50 mg total) by mouth 2 (two) times daily. 180 tablet 1   Magnesium Citrate POWD Take 615 mg by mouth 2 (two) times daily.     Multiple Vitamins-Minerals (MULTIVITAMIN ADULT PO) Take 1 tablet by mouth daily.     Omega-3 Fatty Acids (FISH OIL PO) Take by mouth.     Progesterone POWD Progesterone 112.5mg - Take 2 capsules by mouth at bedtime 180 g 1   thyroid (ARMOUR THYROID) 60  MG tablet TAKE ONE TABLET BY MOUTH BEFORE MEAL 5 DAYS WEEKLY, TAKE 1/2 TABLET BY MOUTH BEFORE MEAL THE OTHER 2 DAYS 90 tablet 0   TURMERIC PO Take by mouth.     conjugated estrogens (PREMARIN) vaginal cream Place 1 Applicatorful vaginally daily. (Patient not taking: Reported on 04/25/2023) 42.5 g 11   nitroGLYCERIN (NITROSTAT) 0.4 MG SL tablet Place 1 tablet (0.4 mg total) under the tongue every 5 (five) minutes as needed for chest pain. (Patient not taking: Reported on 04/25/2023) 25 tablet 1   No current facility-administered medications for this visit.    Known medication allergies: No Known Allergies   Physical examination: Blood pressure (!) 150/88, pulse 68, temperature 98.4 F (36.9 C), temperature source Temporal, resp. rate 16, height 5\' 5"  (1.651 m), weight 166 lb (75.3 kg), SpO2 97%.  General: Alert, interactive, in no acute distress. HEENT: PERRLA, TMs pearly gray, turbinates non-edematous without discharge, post-pharynx non erythematous. Neck: Supple without lymphadenopathy. Lungs: Clear to auscultation without wheezing, rhonchi or rales. {no increased work of breathing. CV: Normal S1, S2 without murmurs. Abdomen: Nondistended, nontender. Skin: Left ear canal with flaking, posterior scalp with white patchy patch . Extremities:  No clubbing, cyanosis or edema. Neuro:   Grossly intact.  Diagnositics/Labs:  Allergy testing: Unable to perform today due to insurance type  Assessment and plan: Food allergy vs food intolerance  - Increased symptoms of congestion following foods including dairy, gluten products.  Will assess between IgE mediated allergy versus food intolerance.  Will obtain serum IgE levels today I will call with these results.  If this testing is negative then we will recommend skin testing to confirm with no IgE presence. Further recommendations regarding avoidance and/or epinephrine need will be determined based on test results. - Did discuss today that dairy  is known to thicken mucus thus can increase congestion which would not represent a direct food allergy. - Ear and scalp dermatitis is eczematous-like.  Continue as needed use of betamethasone as previous directed.   Follow-up pending results  I appreciate the opportunity to take part in Fair Marie's care. Please do not hesitate to contact me with questions.  Sincerely,   Margo Aye, MD Allergy/Immunology [facility]

## 2023-04-29 LAB — ALLERGEN, WHEAT, F4: Wheat IgE: <0.1 kU/L

## 2023-04-29 LAB — ALLERGEN, WHITE POTATO,F35: Allergen Potato, White IgE: <0.1 kU/L

## 2023-04-29 LAB — ALLERGEN, BAKERS YEAST, F45: F045-IgE Yeast: <0.1 kU/L

## 2023-04-29 LAB — ALLERGEN, RYE, F5: Rye IgE: <0.1 kU/L

## 2023-04-29 LAB — ALLERGEN,OAT,F7: Allergen Oat IgE: <0.1 kU/L

## 2023-04-29 LAB — ALLERGEN BARLEY F6: Allergen Barley IgE: <0.1 kU/L

## 2023-04-29 LAB — ALLERGEN MILK: Milk IgE: <0.1 kU/L

## 2023-04-29 NOTE — Assessment & Plan Note (Signed)
Supplement and monitor 

## 2023-04-29 NOTE — Assessment & Plan Note (Signed)
Well controlled, no changes to meds. Encouraged heart healthy diet such as the DASH diet and exercise as tolerated.

## 2023-04-29 NOTE — Assessment & Plan Note (Signed)
Encourage heart healthy diet such as MIND or DASH diet, increase exercise, avoid trans fats, simple carbohydrates and processed foods, consider a krill or fish or flaxseed oil cap daily.  °

## 2023-04-29 NOTE — Assessment & Plan Note (Signed)
Encouraged to get adequate exercise, calcium and vitamin d intake 

## 2023-04-29 NOTE — Assessment & Plan Note (Signed)
hgba1c acceptable, minimize simple carbs. Increase exercise as tolerated.  

## 2023-04-30 ENCOUNTER — Encounter: Payer: Self-pay | Admitting: Family Medicine

## 2023-04-30 ENCOUNTER — Telehealth (INDEPENDENT_AMBULATORY_CARE_PROVIDER_SITE_OTHER): Payer: Medicare Other | Admitting: Family Medicine

## 2023-04-30 DIAGNOSIS — E559 Vitamin D deficiency, unspecified: Secondary | ICD-10-CM

## 2023-04-30 DIAGNOSIS — E782 Mixed hyperlipidemia: Secondary | ICD-10-CM

## 2023-04-30 DIAGNOSIS — G479 Sleep disorder, unspecified: Secondary | ICD-10-CM | POA: Diagnosis not present

## 2023-04-30 DIAGNOSIS — W19XXXA Unspecified fall, initial encounter: Secondary | ICD-10-CM | POA: Diagnosis not present

## 2023-04-30 DIAGNOSIS — S299XXA Unspecified injury of thorax, initial encounter: Secondary | ICD-10-CM

## 2023-04-30 DIAGNOSIS — M8580 Other specified disorders of bone density and structure, unspecified site: Secondary | ICD-10-CM | POA: Diagnosis not present

## 2023-04-30 DIAGNOSIS — R5383 Other fatigue: Secondary | ICD-10-CM

## 2023-04-30 DIAGNOSIS — I10 Essential (primary) hypertension: Secondary | ICD-10-CM | POA: Diagnosis not present

## 2023-04-30 DIAGNOSIS — R519 Headache, unspecified: Secondary | ICD-10-CM | POA: Diagnosis not present

## 2023-04-30 DIAGNOSIS — R739 Hyperglycemia, unspecified: Secondary | ICD-10-CM | POA: Diagnosis not present

## 2023-04-30 MED ORDER — THYROID 60 MG PO TABS: ORAL_TABLET | ORAL | 1 refills | Status: DC

## 2023-04-30 NOTE — Progress Notes (Signed)
MyChart Video Visit    Virtual Visit via Video Note   This patient is at least at moderate risk for complications without adequate follow up. This format is felt to be most appropriate for this patient at this time. Physical exam was limited by quality of the video and audio technology used for the visit. Shamaine, CMA was able to get the patient set up on a video visit.  Patient location: home Patient and provider in visit Provider location: Office  I discussed the limitations of evaluation and management by telemedicine and the availability of in person appointments. The patient expressed understanding and agreed to proceed.  Visit Date: 04/30/2023  Today's healthcare provider: Danise Edge, MD     Subjective:    Patient ID: Megan Shah, female    DOB: 01-Nov-1955, 67 y.o.   MRN: [id number]  No chief complaint on file.   HPI Discussed the use of AI scribe software for clinical note transcription with the patient, who gave verbal consent to proceed.  History of Present Illness   The patient, with a past medical history of osteopenia and hyperglycemia, presents with multiple concerns. The primary concern is a noticeable change in the area around the left clavicle, described as "doughy". The patient denies any pain or discomfort in the area but is concerned due to a family history of lymphoma.  Additionally, the patient reports a recent fall at home, resulting in significant bruising and pain in the left ribs and breast. The fall was caused by tripping over the cat, leading to a direct impact on the edge of the counter and subsequently onto the ground. The patient reports persistent pain in the left ribs, but denies any difficulty in breathing or other respiratory symptoms.  The patient also reports fluctuating blood pressure readings, with systolic readings ranging from 135 to 159 mmHg and diastolic readings mostly in the 80s to low 90s mmHg. The patient occasionally wakes  up with a headache, which resolves after having coffee and taking blood pressure medication.  The patient is currently on Armour Thyroid 60mg , which was recently adjusted due to symptoms of hyperactivity and difficulty sleeping. The patient reports improvement in symptoms since the adjustment.        Past Medical History:  Diagnosis Date  . Atrophic vaginitis 02/07/2016  . Bunion of great toe of right foot 02/07/2016  . Diverticulitis   . History of chicken pox 06/07/2015  . Hyperlipidemia   . Hypertension   . Left shoulder pain 12/25/2016  . Overweight 06/07/2015  . Palpitations 06/07/2015  . Patient is Jehovah's Witness 06/07/2015  . Post menopausal syndrome 06/13/2015  . Preventative health care 12/25/2016  . Right hip pain 06/13/2015  . Thyroid disease   . Transfusion of blood product refused for religious reason 06/07/2015  . Vitamin D deficiency 06/07/2015    Past Surgical History:  Procedure Laterality Date  . PARTIAL HIP ARTHROPLASTY    . WISDOM TOOTH EXTRACTION  Age 75    Family History  Problem Relation Age of Onset  . Arrhythmia Mother   . Arthritis Mother   . Hypertension Mother   . Hyperlipidemia Mother   . Heart disease Mother        CAD with stents, afib  . Atrial fibrillation Mother        flutter  . Arrhythmia Father   . Arthritis Father        broken hip, sepsis   . Cancer Father  colon cancer, mirkel cell carcinoma  . Bunion Father   . Dementia Father   . Atrial fibrillation Father   . Cancer Brother        kidney  . Colon polyps Brother        non cancer polyp  . Cancer Brother        lymphoma in remission  . Atrial fibrillation Brother   . Arthritis Maternal Grandmother   . Arthritis Maternal Grandfather   . Cancer Maternal Grandfather 79       colon cancer  . Arthritis Paternal Grandmother   . Arthritis Paternal Grandfather   . Cancer Paternal Grandfather        colon cancer    Social History   Socioeconomic History  . Marital  status: Married    Spouse name: Not on file  . Number of children: 0  . Years of education: Not on file  . Highest education level: 12th grade  Occupational History  . Occupation: Agricultural consultant  . Occupation: Retired International aid/development worker  Tobacco Use  . Smoking status: Former  . Smokeless tobacco: Never  . Tobacco comments:    Smoked for 2 years as a teenager.  Vaping Use  . Vaping status: Never Used  Substance and Sexual Activity  . Alcohol use: Yes    Alcohol/week: 0.0 standard drinks of alcohol  . Drug use: No  . Sexual activity: Yes    Comment: lives with husband, moved from [place], no dietary restrictions, minimizes wheat and dairy, volunteer work  Other Topics Concern  . Not on file  Social History Narrative  . Not on file   Social Determinants of Health   Financial Resource Strain: Low Risk  (04/25/2023)   Overall Financial Resource Strain (CARDIA)   . Difficulty of Paying Living Expenses: Not very hard  Food Insecurity: No Food Insecurity (04/25/2023)   Hunger Vital Sign   . Worried About Programme researcher, broadcasting/film/video in the Last Year: Never true   . Ran Out of Food in the Last Year: Never true  Transportation Needs: No Transportation Needs (04/25/2023)   PRAPARE - Transportation   . Lack of Transportation (Medical): No   . Lack of Transportation (Non-Medical): No  Physical Activity: Insufficiently Active (04/25/2023)   Exercise Vital Sign   . Days of Exercise per Week: 2 days   . Minutes of Exercise per Session: 30 min  Stress: Stress Concern Present (04/25/2023)   [employer] of Occupational Health - Occupational Stress Questionnaire   . Feeling of Stress : To some extent  Social Connections: Socially Integrated (04/25/2023)   Social Connection and Isolation Panel [NHANES]   . Frequency of Communication with Friends and Family: More than three times a week   . Frequency of Social Gatherings with Friends and Family: More than three times a week   . Attends Religious  Services: More than 4 times per year   . Active Member of Clubs or Organizations: Yes   . Attends Banker Meetings: More than 4 times per year   . Marital Status: Married  Catering manager Violence: Not At Risk (03/12/2023)   Humiliation, Afraid, Rape, and Kick questionnaire   . Fear of Current or Ex-Partner: No   . Emotionally Abused: No   . Physically Abused: No   . Sexually Abused: No    Outpatient Medications Prior to Visit  Medication Sig Dispense Refill  . betamethasone dipropionate 0.05 % lotion Apply topically 2 (two) times daily. 60 mL 0  .  Cholecalciferol 50 MCG (2000 UT) TABS Take 4,000 Units by mouth 2 (two) times daily.     Marland Kitchen DHEA 10 MG CAPS Take by mouth daily.    Marland Kitchen losartan (COZAAR) 50 MG tablet Take 1 tablet (50 mg total) by mouth 2 (two) times daily. 180 tablet 1  . Magnesium Citrate POWD Take 615 mg by mouth 2 (two) times daily.    . Multiple Vitamins-Minerals (MULTIVITAMIN ADULT PO) Take 1 tablet by mouth daily.    . Omega-3 Fatty Acids (FISH OIL PO) Take by mouth.    . Progesterone POWD Progesterone 112.5mg - Take 2 capsules by mouth at bedtime 180 g 1  . TURMERIC PO Take by mouth.    . conjugated estrogens (PREMARIN) vaginal cream Place 1 Applicatorful vaginally daily. (Patient not taking: Reported on 04/25/2023) 42.5 g 11  . nitroGLYCERIN (NITROSTAT) 0.4 MG SL tablet Place 1 tablet (0.4 mg total) under the tongue every 5 (five) minutes as needed for chest pain. (Patient not taking: Reported on 04/25/2023) 25 tablet 1  . thyroid (ARMOUR THYROID) 60 MG tablet TAKE ONE TABLET BY MOUTH BEFORE MEAL 5 DAYS WEEKLY, TAKE 1/2 TABLET BY MOUTH BEFORE MEAL THE OTHER 2 DAYS 90 tablet 0   No facility-administered medications prior to visit.    No Known Allergies  Review of Systems  Constitutional:  Negative for fever and malaise/fatigue.  HENT:  Negative for congestion.   Eyes:  Negative for blurred vision.  Respiratory:  Negative for shortness of breath.    Cardiovascular:  Negative for chest pain, palpitations and leg swelling.  Gastrointestinal:  Negative for abdominal pain, blood in stool and nausea.  Genitourinary:  Negative for dysuria and frequency.  Musculoskeletal:  Positive for falls, joint pain and myalgias.  Skin:  Negative for rash.  Neurological:  Negative for dizziness, loss of consciousness and headaches.  Endo/Heme/Allergies:  Negative for environmental allergies.  Psychiatric/Behavioral:  Negative for depression. The patient is not nervous/anxious.        Objective:    Physical Exam Constitutional:      General: She is not in acute distress.    Appearance: Normal appearance. She is not ill-appearing or toxic-appearing.  HENT:     Head: Normocephalic and atraumatic.     Right Ear: External ear normal.     Left Ear: External ear normal.     Nose: Nose normal.  Eyes:     General:        Right eye: No discharge.        Left eye: No discharge.  Pulmonary:     Effort: Pulmonary effort is normal.  Skin:    Findings: No rash.  Neurological:     Mental Status: She is alert and oriented to person, place, and time.  Psychiatric:        Behavior: Behavior normal.    There were no vitals taken for this visit. Wt Readings from Last 3 Encounters:  04/25/23 166 lb (75.3 kg)  03/12/23 160 lb (72.6 kg)  12/04/22 165 lb (74.8 kg)       Assessment & Plan:  Hyperglycemia Assessment & Plan: hgba1c acceptable, minimize simple carbs. Increase exercise as tolerated.   Orders: -     Comprehensive metabolic panel; Future -     CBC with Differential/Platelet; Future -     Hemoglobin A1c; Future  Mixed hyperlipidemia Assessment & Plan: Encourage heart healthy diet such as MIND or DASH diet, increase exercise, avoid trans fats, simple carbohydrates and processed foods, consider  a krill or fish or flaxseed oil cap daily.    Orders: -     Lipid panel; Future -     CBC with Differential/Platelet; Future  Primary  hypertension Assessment & Plan: Well controlled often but still seeing some systolic numbers in the 140s, no changes to meds. Encouraged heart healthy diet such as the DASH diet and exercise as tolerated. Feeling well she will report persistent concerns.  Orders: -     CBC with Differential/Platelet; Future -     TSH; Future  Osteopenia, unspecified location Assessment & Plan: Encouraged to get adequate exercise, calcium and vitamin d intake   Orders: -     VITAMIN D 25 Hydroxy (Vit-D Deficiency, Fractures); Future  Vitamin D deficiency Assessment & Plan: Supplement and monitor   Orders: -     VITAMIN D 25 Hydroxy (Vit-D Deficiency, Fractures); Future  Fall, initial encounter -     DG Clavicle Left; Future -     DG Ribs Unilateral Left; Future  Trauma of chest, initial encounter -     DG Clavicle Left; Future -     DG Ribs Unilateral Left; Future  Nonintractable headache, unspecified chronicity pattern, unspecified headache type -     Ambulatory referral to Pulmonology  Other fatigue -     Ambulatory referral to Pulmonology  Restless sleeper -     Ambulatory referral to Pulmonology  Other orders -     Thyroid; TAKE ONE TABLET BY MOUTH BEFORE MEAL 5 DAYS WEEKLY, TAKE 1/2 TABLET BY MOUTH BEFORE MEAL THE OTHER 2 DAYS  Dispense: 90 tablet; Refill: 1     Assessment and Plan    Asymmetrical Clavicle Noted asymmetry in clavicle with no history of trauma to clavicle. Discussed possible causes including age-related changes, arthritis, and muscle spasm. Family history of lymphoma noted. -Order X-ray of left clavicle to rule out bony lesions or displacement.  Breast Trauma Recent trauma to the breast with persistent bruising and tenderness. Mammogram due in February. -Plan to delay mammogram until after resolution of bruising to avoid false positive findings.  Hyperglycemia History of hyperglycemia with recent weight loss of 30 pounds. -Order blood work including glucose  level to assess current status.  Osteopenia History of osteopenia with ongoing calcium and vitamin D supplementation. -Request previous DEXA scan results for comparison.  Hypertension Blood pressure readings varying from 135/80 to 159/81. Morning headaches reported. -Continue current antihypertensive regimen.  Sleep Apnea Morning headaches and family reports of snoring raise suspicion of sleep apnea, which could contribute to hypertension. -Refer to pulmonology for at-home sleep study.  Thyroid Disorder On Armour Thyroid 60mg  daily. Reports of feeling "super charged" after starting grounding sheet. -Order thyroid function tests to assess current status.  Rib Pain Left-sided rib pain after recent trauma. Also reports of occasional lung discomfort. -Order X-ray of left ribs to rule out fracture or other injury.  Follow-up in 3 months or sooner if any unexpected findings in blood work or X-rays.         I discussed the assessment and treatment plan with the patient. The patient was provided an opportunity to ask questions and all were answered. The patient agreed with the plan and demonstrated an understanding of the instructions.   The patient was advised to call back or seek an in-person evaluation if the symptoms worsen or if the condition fails to improve as anticipated.  Danise Edge, MD [facility] [phone] (phone) [phone] (fax)  [facility]

## 2023-05-04 ENCOUNTER — Telehealth: Payer: Self-pay | Admitting: Allergy

## 2023-05-04 NOTE — Telephone Encounter (Signed)
Patient called back about results. Patient states he read the mychart message that was sent to her about it. Patient will call back to schedule skin testing when she is able to - at this time patient does not have transportation.

## 2023-06-18 ENCOUNTER — Encounter: Payer: Self-pay | Admitting: Family Medicine

## 2023-06-18 MED ORDER — PROGESTERONE POWD: 1 refills | Status: DC

## 2023-07-02 ENCOUNTER — Other Ambulatory Visit (INDEPENDENT_AMBULATORY_CARE_PROVIDER_SITE_OTHER): Payer: Medicare Other

## 2023-07-02 ENCOUNTER — Telehealth: Payer: Self-pay | Admitting: Family Medicine

## 2023-07-02 DIAGNOSIS — I10 Essential (primary) hypertension: Secondary | ICD-10-CM

## 2023-07-02 DIAGNOSIS — E559 Vitamin D deficiency, unspecified: Secondary | ICD-10-CM | POA: Diagnosis not present

## 2023-07-02 DIAGNOSIS — R739 Hyperglycemia, unspecified: Secondary | ICD-10-CM | POA: Diagnosis not present

## 2023-07-02 DIAGNOSIS — M8580 Other specified disorders of bone density and structure, unspecified site: Secondary | ICD-10-CM | POA: Diagnosis not present

## 2023-07-02 DIAGNOSIS — E782 Mixed hyperlipidemia: Secondary | ICD-10-CM | POA: Diagnosis not present

## 2023-07-02 LAB — CBC WITH DIFFERENTIAL/PLATELET
Basophils Absolute: 0 10*3/uL (ref 0.0–0.1)
Basophils Relative: 0.8 % (ref 0.0–3.0)
Eosinophils Absolute: 0.1 10*3/uL (ref 0.0–0.7)
Eosinophils Relative: 1 % (ref 0.0–5.0)
HCT: 42.9 % (ref 36.0–46.0)
Hemoglobin: 14.4 g/dL (ref 12.0–15.0)
Lymphocytes Relative: 39 % (ref 12.0–46.0)
Lymphs Abs: 2.2 10*3/uL (ref 0.7–4.0)
MCHC: 33.5 g/dL (ref 30.0–36.0)
MCV: 90.9 fL (ref 78.0–100.0)
Monocytes Absolute: 0.5 10*3/uL (ref 0.1–1.0)
Monocytes Relative: 9.1 % (ref 3.0–12.0)
Neutro Abs: 2.9 10*3/uL (ref 1.4–7.7)
Neutrophils Relative %: 50.1 % (ref 43.0–77.0)
Platelets: 272 10*3/uL (ref 150.0–400.0)
RBC: 4.71 Mil/uL (ref 3.87–5.11)
RDW: 13 % (ref 11.5–15.5)
WBC: 5.7 10*3/uL (ref 4.0–10.5)

## 2023-07-02 LAB — LIPID PANEL
Cholesterol: 234 mg/dL — ABNORMAL HIGH (ref 0–200)
HDL: 80.2 mg/dL (ref >39.00)
LDL Cholesterol: 132 mg/dL — ABNORMAL HIGH (ref 0–99)
NonHDL: 153.96
Total CHOL/HDL Ratio: 3
Triglycerides: 108 mg/dL (ref 0.0–149.0)
VLDL: 21.6 mg/dL (ref 0.0–40.0)

## 2023-07-02 LAB — TSH: TSH: 0.81 u[IU]/mL (ref 0.35–5.50)

## 2023-07-02 LAB — COMPREHENSIVE METABOLIC PANEL
ALT: 17 U/L (ref 0–35)
AST: 21 U/L (ref 0–37)
Albumin: 4.8 g/dL (ref 3.5–5.2)
Alkaline Phosphatase: 56 U/L (ref 39–117)
BUN: 14 mg/dL (ref 6–23)
CO2: 26 meq/L (ref 19–32)
Calcium: 10.1 mg/dL (ref 8.4–10.5)
Chloride: 100 meq/L (ref 96–112)
Creatinine, Ser: 0.65 mg/dL (ref 0.40–1.20)
GFR: 90.87 mL/min (ref >60.00)
Glucose, Bld: 87 mg/dL (ref 70–99)
Potassium: 4.6 meq/L (ref 3.5–5.1)
Sodium: 136 meq/L (ref 135–145)
Total Bilirubin: 0.8 mg/dL (ref 0.2–1.2)
Total Protein: 7 g/dL (ref 6.0–8.3)

## 2023-07-02 LAB — VITAMIN D 25 HYDROXY (VIT D DEFICIENCY, FRACTURES): VITD: 62.85 ng/mL (ref 30.00–100.00)

## 2023-07-02 LAB — HEMOGLOBIN A1C: Hgb A1c MFr Bld: 5.4 % (ref 4.6–6.5)

## 2023-07-02 NOTE — Telephone Encounter (Signed)
FYI

## 2023-07-02 NOTE — Telephone Encounter (Signed)
Pt came in office to get labs done and wanted to inform provider that the order that she had for xrays when she fell is not needed due to that pt is doing a lot better and feels like is not needing the xray, has no pain now. Pt wanted provider aware.

## 2023-08-01 ENCOUNTER — Encounter: Payer: Self-pay | Admitting: Family Medicine

## 2023-08-14 NOTE — Telephone Encounter (Signed)
Pt states her bp is still running high, 165/105 today. She does not complain of a headache or any other sxs but would like to know if her medication should be adjusted. Please advise.    [facility] [id number] [place], [place] - [address] [place], [place] [address] Phone: [phone]  Fax: [phone]

## 2023-08-15 ENCOUNTER — Other Ambulatory Visit: Payer: Self-pay | Admitting: Emergency Medicine

## 2023-08-15 ENCOUNTER — Other Ambulatory Visit: Payer: Self-pay | Admitting: Family

## 2023-08-15 MED ORDER — AMLODIPINE BESYLATE 5 MG PO TABS: 5.0000 mg | ORAL_TABLET | Freq: Every day | ORAL | 3 refills | Status: DC

## 2023-08-15 NOTE — Telephone Encounter (Signed)
2 week follow up has been scheduled.

## 2023-09-03 ENCOUNTER — Ambulatory Visit (INDEPENDENT_AMBULATORY_CARE_PROVIDER_SITE_OTHER): Payer: Medicare Other | Admitting: Medical

## 2023-09-03 ENCOUNTER — Encounter: Payer: Self-pay | Admitting: Medical

## 2023-09-03 VITALS — BP 120/78 | HR 92 | Resp 18 | Ht 65.0 in | Wt 174.0 lb

## 2023-09-03 DIAGNOSIS — R739 Hyperglycemia, unspecified: Secondary | ICD-10-CM | POA: Diagnosis not present

## 2023-09-03 DIAGNOSIS — I10 Essential (primary) hypertension: Secondary | ICD-10-CM

## 2023-09-03 MED ORDER — AMLODIPINE BESYLATE 5 MG PO TABS: 5.0000 mg | ORAL_TABLET | Freq: Every day | ORAL | 3 refills | Status: DC

## 2023-09-03 NOTE — Progress Notes (Signed)
Subjective:    Patient ID: Megan Shah, female    DOB: June 12, 1956, 67 y.o.   MRN: [id number]  HPI  Discussed the use of AI scribe software for clinical note transcription with the patient, who gave verbal consent to proceed.  Discussed the use of AI scribe software for clinical note transcription with the patient, who gave verbal consent to proceed.  History of Present Illness   The patient, with a history of hypertension, presented with concerns about her blood pressure management. She reported that despite taking Losartan 50mg  twice daily, her blood pressure readings at home were consistently high, sometimes reaching 150/100. She also noted occasional diastolic readings in the 90s. The patient was recently started on Amlodipine, which she believes has helped to lower her blood pressure.  In addition to her hypertension, the patient has been actively managing her weight. She reported a weight loss of 35 pounds through calorie counting, but has since regained 14 pounds. Despite the weight gain, she is committed to continuing her weight loss efforts. She denied any new symptoms or health problems.  The patient also has a history of borderline elevated A1C levels, which have improved from 5.7 to 5.4. She reported adherence to a low sugar diet and expressed a desire to increase her physical activity. She denied any symptoms suggestive of poorly controlled blood glucose levels.  Overall, the patient's primary concern is the management of her hypertension, with a secondary focus on weight loss and blood glucose control. She expressed a preference for managing her health issues through lifestyle modifications where possible.           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   Bunion of great toe of right foot 02/07/2016   Diverticulitis    History of chicken pox 06/07/2015   Hyperlipidemia    Hypertension    Left shoulder pain 12/25/2016   Overweight 06/07/2015    Palpitations 06/07/2015   Patient is Jehovah's Witness 06/07/2015   Post menopausal syndrome 06/13/2015   Preventative health care 12/25/2016   Right hip pain 06/13/2015   Thyroid disease    Transfusion of blood product refused for religious reason 06/07/2015   Vitamin D deficiency 06/07/2015     Social History   Socioeconomic History   Marital status: Married    Spouse name: Not on file   Number of children: 0   Years of education: Not on file   Highest education level: 12th grade  Occupational History   Occupation: Agricultural consultant   Occupation: Retired Education officer, environmental business  Tobacco Use   Smoking status: Former   Smokeless tobacco: Never   Tobacco comments:    Smoked for 2 years as a teenager.  Vaping Use   Vaping status: Never Used  Substance and Sexual Activity   Alcohol use: Yes    Alcohol/week: 0.0 standard drinks of alcohol   Drug use: No   Sexual activity: Yes    Comment: lives with husband, moved from [place], no dietary restrictions, minimizes wheat and dairy, volunteer work  Other Topics Concern   Not on file  Social History Narrative   Not on file   Social Drivers of Health   Financial Resource Strain: Low Risk  (04/25/2023)   Overall Financial Resource Strain (CARDIA)    Difficulty of Paying Living Expenses: Not very hard  Food Insecurity: No Food Insecurity (04/25/2023)   Hunger Vital Sign    Worried About Running Out of Food in the Last Year:  Never true    Ran Out of Food in the Last Year: Never true  Transportation Needs: No Transportation Needs (04/25/2023)   PRAPARE - Administrator, Civil Service (Medical): No    Lack of Transportation (Non-Medical): No  Physical Activity: Insufficiently Active (04/25/2023)   Exercise Vital Sign    Days of Exercise per Week: 2 days    Minutes of Exercise per Session: 30 min  Stress: Stress Concern Present (04/25/2023)   [employer] of Occupational Health - Occupational Stress Questionnaire    Feeling of  Stress : To some extent  Social Connections: Socially Integrated (04/25/2023)   Social Connection and Isolation Panel [NHANES]    Frequency of Communication with Friends and Family: More than three times a week    Frequency of Social Gatherings with Friends and Family: More than three times a week    Attends Religious Services: More than 4 times per year    Active Member of [employer] or Organizations: Yes    Attends Engineer, structural: More than 4 times per year    Marital Status: Married  Catering manager Violence: Not At Risk (03/12/2023)   Humiliation, Afraid, Rape, and Kick questionnaire    Fear of Current or Ex-Partner: No    Emotionally Abused: No    Physically Abused: No    Sexually Abused: No    Past Surgical History:  Procedure Laterality Date   PARTIAL HIP ARTHROPLASTY     WISDOM TOOTH EXTRACTION  Age 9    Family History  Problem Relation Age of Onset   Arrhythmia Mother    Arthritis Mother    Hypertension Mother    Hyperlipidemia Mother    Heart disease Mother        CAD with stents, afib   Atrial fibrillation Mother        flutter   Arrhythmia Father    Arthritis Father        broken hip, sepsis    Cancer Father        colon cancer, mirkel cell carcinoma   Bunion Father    Dementia Father    Atrial fibrillation Father    Cancer Brother        kidney   Colon polyps Brother        non cancer polyp   Cancer Brother        lymphoma in remission   Atrial fibrillation Brother    Arthritis Maternal Grandmother    Arthritis Maternal Grandfather    Cancer Maternal Grandfather 79       colon cancer   Arthritis Paternal Grandmother    Arthritis Paternal Grandfather    Cancer Paternal Grandfather        colon cancer    No Known Allergies  Current Outpatient Medications on File Prior to Visit  Medication Sig Dispense Refill   amLODipine (NORVASC) 5 MG tablet Take 1 tablet (5 mg total) by mouth daily. 30 tablet 3   betamethasone dipropionate 0.05 %  lotion Apply topically 2 (two) times daily. 60 mL 0   Cholecalciferol 50 MCG (2000 UT) TABS Take 4,000 Units by mouth 2 (two) times daily.      DHEA 10 MG CAPS Take by mouth daily.     losartan (COZAAR) 50 MG tablet Take 1 tablet (50 mg total) by mouth 2 (two) times daily. 180 tablet 1   Magnesium Citrate POWD Take 615 mg by mouth 2 (two) times daily.     Multiple Vitamins-Minerals (  MULTIVITAMIN ADULT PO) Take 1 tablet by mouth daily.     Omega-3 Fatty Acids (FISH OIL PO) Take by mouth.     Progesterone POWD Progesterone 112.5mg - Take 2 capsules by mouth at bedtime 180 g 1   thyroid (ARMOUR THYROID) 60 MG tablet TAKE ONE TABLET BY MOUTH BEFORE MEAL 5 DAYS WEEKLY, TAKE 1/2 TABLET BY MOUTH BEFORE MEAL THE OTHER 2 DAYS 90 tablet 1   TURMERIC PO Take by mouth.     No current facility-administered medications on file prior to visit.    BP 120/78   Pulse 92   Resp 18   Ht 5\' 5"  (1.651 m)   Wt 174 lb (78.9 kg)   SpO2 99%   BMI 28.96 kg/m       Review of Systems  Constitutional:  Negative for chills, fatigue and fever.  HENT:  Negative for congestion.   Respiratory:  Negative for cough, chest tightness and wheezing.   Cardiovascular:  Negative for chest pain and palpitations.  Gastrointestinal:  Negative for abdominal pain.  Genitourinary:  Negative for dysuria, flank pain and frequency.  Musculoskeletal:  Negative for back pain and gait problem.  Skin:  Negative for rash.  Neurological:  Negative for dizziness, seizures, weakness and light-headedness.  Hematological:  Negative for adenopathy. Does not bruise/bleed easily.  Psychiatric/Behavioral:  Negative for behavioral problems, confusion and decreased concentration.     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   Bunion of great toe of right foot 02/07/2016   Diverticulitis    History of chicken pox 06/07/2015   Hyperlipidemia    Hypertension    Left shoulder pain 12/25/2016   Overweight 06/07/2015    Palpitations 06/07/2015   Patient is Jehovah's Witness 06/07/2015   Post menopausal syndrome 06/13/2015   Preventative health care 12/25/2016   Right hip pain 06/13/2015   Thyroid disease    Transfusion of blood product refused for religious reason 06/07/2015   Vitamin D deficiency 06/07/2015     Social History   Socioeconomic History   Marital status: Married    Spouse name: Not on file   Number of children: 0   Years of education: Not on file   Highest education level: 12th grade  Occupational History   Occupation: Agricultural consultant   Occupation: Retired Education officer, environmental business  Tobacco Use   Smoking status: Former   Smokeless tobacco: Never   Tobacco comments:    Smoked for 2 years as a teenager.  Vaping Use   Vaping status: Never Used  Substance and Sexual Activity   Alcohol use: Yes    Alcohol/week: 0.0 standard drinks of alcohol   Drug use: No   Sexual activity: Yes    Comment: lives with husband, moved from [place], no dietary restrictions, minimizes wheat and dairy, volunteer work  Other Topics Concern   Not on file  Social History Narrative   Not on file   Social Drivers of Health   Financial Resource Strain: Low Risk  (04/25/2023)   Overall Financial Resource Strain (CARDIA)    Difficulty of Paying Living Expenses: Not very hard  Food Insecurity: No Food Insecurity (04/25/2023)   Hunger Vital Sign    Worried About Running Out of Food in the Last Year: Never true    Ran Out of Food in the Last Year: Never true  Transportation Needs: No Transportation Needs (04/25/2023)   PRAPARE - Transportation    Lack of Transportation (Medical): No    Lack of  Transportation (Non-Medical): No  Physical Activity: Insufficiently Active (04/25/2023)   Exercise Vital Sign    Days of Exercise per Week: 2 days    Minutes of Exercise per Session: 30 min  Stress: Stress Concern Present (04/25/2023)   [employer] of Occupational Health - Occupational Stress Questionnaire    Feeling of Stress  : To some extent  Social Connections: Socially Integrated (04/25/2023)   Social Connection and Isolation Panel [NHANES]    Frequency of Communication with Friends and Family: More than three times a week    Frequency of Social Gatherings with Friends and Family: More than three times a week    Attends Religious Services: More than 4 times per year    Active Member of [employer] or Organizations: Yes    Attends Engineer, structural: More than 4 times per year    Marital Status: Married  Catering manager Violence: Not At Risk (03/12/2023)   Humiliation, Afraid, Rape, and Kick questionnaire    Fear of Current or Ex-Partner: No    Emotionally Abused: No    Physically Abused: No    Sexually Abused: No    Past Surgical History:  Procedure Laterality Date   PARTIAL HIP ARTHROPLASTY     WISDOM TOOTH EXTRACTION  Age 70    Family History  Problem Relation Age of Onset   Arrhythmia Mother    Arthritis Mother    Hypertension Mother    Hyperlipidemia Mother    Heart disease Mother        CAD with stents, afib   Atrial fibrillation Mother        flutter   Arrhythmia Father    Arthritis Father        broken hip, sepsis    Cancer Father        colon cancer, mirkel cell carcinoma   Bunion Father    Dementia Father    Atrial fibrillation Father    Cancer Brother        kidney   Colon polyps Brother        non cancer polyp   Cancer Brother        lymphoma in remission   Atrial fibrillation Brother    Arthritis Maternal Grandmother    Arthritis Maternal Grandfather    Cancer Maternal Grandfather 36       colon cancer   Arthritis Paternal Grandmother    Arthritis Paternal Grandfather    Cancer Paternal Grandfather        colon cancer    No Known Allergies  Current Outpatient Medications on File Prior to Visit  Medication Sig Dispense Refill   amLODipine (NORVASC) 5 MG tablet Take 1 tablet (5 mg total) by mouth daily. 30 tablet 3   betamethasone dipropionate 0.05 % lotion  Apply topically 2 (two) times daily. 60 mL 0   Cholecalciferol 50 MCG (2000 UT) TABS Take 4,000 Units by mouth 2 (two) times daily.      DHEA 10 MG CAPS Take by mouth daily.     losartan (COZAAR) 50 MG tablet Take 1 tablet (50 mg total) by mouth 2 (two) times daily. 180 tablet 1   Magnesium Citrate POWD Take 615 mg by mouth 2 (two) times daily.     Multiple Vitamins-Minerals (MULTIVITAMIN ADULT PO) Take 1 tablet by mouth daily.     Omega-3 Fatty Acids (FISH OIL PO) Take by mouth.     Progesterone POWD Progesterone 112.5mg - Take 2 capsules by mouth at bedtime 180 g 1  thyroid (ARMOUR THYROID) 60 MG tablet TAKE ONE TABLET BY MOUTH BEFORE MEAL 5 DAYS WEEKLY, TAKE 1/2 TABLET BY MOUTH BEFORE MEAL THE OTHER 2 DAYS 90 tablet 1   TURMERIC PO Take by mouth.     No current facility-administered medications on file prior to visit.    BP 134/74   Pulse 92   Resp 18   Ht 5\' 5"  (1.651 m)   Wt 174 lb (78.9 kg)   SpO2 99%   BMI 28.96 kg/m        Objective:   Physical Exam  General Mental Status- Alert. General Appearance- Not in acute distress.   Skin General: Color- Normal Color. Moisture- Normal Moisture.  Neck Carotid Arteries- Normal color. Moisture- Normal Moisture. No carotid bruits. No JVD.  Chest and Lung Exam Auscultation: Breath Sounds:-Normal.  Cardiovascular Auscultation:Rythm- Regular. Murmurs & Other Heart Sounds:Auscultation of the heart reveals- No Murmurs.  Abdomen Inspection:-Inspeection Normal. Palpation/Percussion:Note:No mass. Palpation and Percussion of the abdomen reveal- Non Tender, Non Distended + BS, no rebound or guarding.   Neurologic Cranial Nerve exam:- CN III-XII intact(No nystagmus), symmetric smile. Strength:- 5/5 equal and symmetric strength both upper and lower extremities.       Assessment & Plan:  Hypertension Blood pressure controlled on current regimen of Losartan 50mg  twice daily and Amlodipine 5mg  daily. Discussed the importance of  consistent medication adherence and the potential impact of weight loss on blood pressure control. -Continue Losartan 50mg  twice daily and Amlodipine 5mg  daily. -Refill Amlodipine for 90 days with 3 refills.  Weight Management Patient reported weight gain after initial weight loss through calorie counting. Expressed intent to resume weight loss efforts. -Encouraged continuation of calorie counting and healthy eating habits. -Recommended daily physical activity, such as walking for 30 minutes.  Elevated sugar mild in the past A1c improved from 5.7 to 5.4 with weight loss. No current need for medication. -Continue low sugar diet and regular exercise. -Monitor A1c regularly to detect any increase.  Follow up in 6 months with pcp or sooner if needed  [employer], PA-C

## 2023-09-03 NOTE — Patient Instructions (Addendum)
Hypertension Blood pressure controlled on current regimen of Losartan 50mg  twice daily and Amlodipine 5mg  daily. Discussed the importance of consistent medication adherence and the potential impact of weight loss on blood pressure control. -Continue Losartan 50mg  twice daily and Amlodipine 5mg  daily. -Refill Amlodipine for 90 days with 3 refills.  Weight Management Patient reported weight gain after initial weight loss through calorie counting. Expressed intent to resume weight loss efforts. -Encouraged continuation of calorie counting and healthy eating habits. -Recommended daily physical activity, such as walking for 30 minutes.  Elevatd sugar mild in the past A1c improved from 5.7 to 5.4 with weight loss. No current need for medication. -Continue low sugar diet and regular exercise. -Monitor A1c regularly to detect any increase.  Follow up in 6 months with pcp or sooner if needed

## 2023-09-04 DIAGNOSIS — H903 Sensorineural hearing loss, bilateral: Secondary | ICD-10-CM | POA: Diagnosis not present

## 2023-09-11 ENCOUNTER — Other Ambulatory Visit: Payer: Self-pay | Admitting: Family Medicine

## 2023-09-18 ENCOUNTER — Other Ambulatory Visit: Payer: Self-pay | Admitting: Family Medicine

## 2023-09-18 ENCOUNTER — Encounter: Payer: Self-pay | Admitting: Family Medicine

## 2023-09-18 DIAGNOSIS — N6310 Unspecified lump in the right breast, unspecified quadrant: Secondary | ICD-10-CM

## 2023-09-27 ENCOUNTER — Ambulatory Visit
Admission: RE | Admit: 2023-09-27 | Discharge: 2023-09-27 | Disposition: A | Discharge status: Home / Self Care | Payer: Medicare Other | Source: Ambulatory Visit | Attending: Family Medicine | Admitting: Family Medicine

## 2023-09-27 DIAGNOSIS — N6310 Unspecified lump in the right breast, unspecified quadrant: Secondary | ICD-10-CM

## 2023-09-27 DIAGNOSIS — N6459 Other signs and symptoms in breast: Secondary | ICD-10-CM | POA: Diagnosis not present

## 2023-09-28 ENCOUNTER — Encounter: Payer: Self-pay | Admitting: Family

## 2023-09-28 ENCOUNTER — Other Ambulatory Visit: Payer: Self-pay | Admitting: Family

## 2023-09-28 DIAGNOSIS — L988 Other specified disorders of the skin and subcutaneous tissue: Secondary | ICD-10-CM

## 2023-09-28 NOTE — Telephone Encounter (Signed)
Called and spoke with patient. She was made aware of provider's notes and questions. She would like to know if you can recommend a breast cancer surgeon

## 2023-09-29 ENCOUNTER — Other Ambulatory Visit: Payer: Self-pay | Admitting: Family

## 2023-09-29 DIAGNOSIS — L988 Other specified disorders of the skin and subcutaneous tissue: Secondary | ICD-10-CM

## 2023-10-01 NOTE — Addendum Note (Signed)
Addended by: Marian Sorrow D on: 10/01/2023 08:53 AM   Modules accepted: Orders

## 2023-10-01 NOTE — Telephone Encounter (Signed)
Plastic surgery referral has been canceled

## 2023-10-08 DIAGNOSIS — Z800 Family history of malignant neoplasm of digestive organs: Secondary | ICD-10-CM | POA: Diagnosis not present

## 2023-10-08 DIAGNOSIS — D241 Benign neoplasm of right breast: Secondary | ICD-10-CM | POA: Diagnosis not present

## 2023-10-08 DIAGNOSIS — L919 Hypertrophic disorder of the skin, unspecified: Secondary | ICD-10-CM | POA: Diagnosis not present

## 2023-10-08 DIAGNOSIS — N644 Mastodynia: Secondary | ICD-10-CM | POA: Diagnosis not present

## 2023-10-12 LAB — POCT ABI - SCREENING FOR PILOT NO CHARGE
Left ABI: 1.11
Right ABI: 1.15

## 2023-10-12 NOTE — Progress Notes (Signed)
 No issues at this time.

## 2023-10-15 DIAGNOSIS — H2513 Age-related nuclear cataract, bilateral: Secondary | ICD-10-CM | POA: Diagnosis not present

## 2023-10-15 DIAGNOSIS — H04123 Dry eye syndrome of bilateral lacrimal glands: Secondary | ICD-10-CM | POA: Diagnosis not present

## 2023-10-15 DIAGNOSIS — H43813 Vitreous degeneration, bilateral: Secondary | ICD-10-CM | POA: Diagnosis not present

## 2023-10-29 DIAGNOSIS — N649 Disorder of breast, unspecified: Secondary | ICD-10-CM | POA: Diagnosis not present

## 2023-10-29 DIAGNOSIS — N644 Mastodynia: Secondary | ICD-10-CM | POA: Diagnosis not present

## 2023-11-07 NOTE — Progress Notes (Signed)
 The patient attended a screening event on 10/12/23 where her screening results were ABI 1.15 (Right), 1.11 (Left). No additional screening results indicated. Pt is not a smoker, has PCP, and insurance. No SDOH needs were indicated at the event.  Per chart review pt is seeing Dr. Danise Edge - [facility]. Pt has seen PCP in the last 12 months on 04/30/2023. Most recent Chart review also indicated no future appts with PCP or other providers. She also was seen by Esperanza Richters, PA as a referral on 09/02/24 also in the same office as her PCP.  No additional Health equity team support indicated at this time.

## 2023-11-09 ENCOUNTER — Other Ambulatory Visit: Payer: Self-pay | Admitting: Family Medicine

## 2023-12-25 ENCOUNTER — Encounter: Payer: Self-pay | Admitting: Family Medicine

## 2023-12-31 ENCOUNTER — Other Ambulatory Visit: Payer: Self-pay | Admitting: Family Medicine

## 2024-01-17 ENCOUNTER — Telehealth: Admitting: Nurse Practitioner

## 2024-01-17 DIAGNOSIS — S60562A Insect bite (nonvenomous) of left hand, initial encounter: Secondary | ICD-10-CM | POA: Diagnosis not present

## 2024-01-17 DIAGNOSIS — W57XXXA Bitten or stung by nonvenomous insect and other nonvenomous arthropods, initial encounter: Secondary | ICD-10-CM | POA: Insufficient documentation

## 2024-01-17 MED ORDER — LOSARTAN POTASSIUM 50 MG PO TABS
50.0000 mg | ORAL_TABLET | Freq: Every day | ORAL | Status: DC
Start: 2024-01-17 — End: 2024-03-12

## 2024-01-17 MED ORDER — MUPIROCIN 2 % EX OINT: 1.0000 | TOPICAL_OINTMENT | Freq: Two times a day (BID) | CUTANEOUS | 0 refills | Status: AC

## 2024-01-17 NOTE — Assessment & Plan Note (Signed)
 Acute Appears to be resolving and improving. Per shared decision making she would like to treat with topical antibiotic as opposed to systemic.  I am agreeable to this as other than swelling no obvious clinical signs of infection currently. I did recommend she get updated tetanus vaccine, she can either accomplish this at her pharmacy or she can make a nurses visit appointment tomorrow at my office or her PCPs office to have tetanus vaccine administered.  She reports her understanding. She was told to call office if symptoms worsen especially if new signs of infection occur and educated on what the signs are.  She reports understanding.

## 2024-01-17 NOTE — Progress Notes (Signed)
   Established Patient Office Visit  An audio/visual tele-health visit was completed today for this patient. I connected with  Fair Mikael Albright on 01/17/24 utilizing audio/visual technology and verified that I am speaking with the correct person using two identifiers. The patient was located at their home, and I was located at the office of [facility] at [facility] during the encounter. I discussed the limitations of evaluation and management by telemedicine. The patient expressed understanding and agreed to proceed.     Subjective   Patient ID: Megan Shah, female    DOB: 1955-10-14  Age: 68 y.o. MRN: [id number]  Chief Complaint  Patient presents with   Edema    Happened Tuesday, on the left hand, pt think something bite her. She stated that there is some pain and swelling going on but has went down a little due taking ibuprofen.    Patient arrives today for the above.  Was working out in her garden about 2 to 3 days ago.  After she finished working she started noticing some pain to her left hand.  She was identified what looks to be a break in the skin that was consistent with some sort of an insect bite.  Start experiencing some swelling and redness in her hand with a little bit of purulent discharge.  Today, symptoms are improved.  Swelling is less, no additional purulent discharge.  He did want to be seen to discuss whether or not there are any additional treatment recommendations.  She says last tetanus vaccine is unknown but likely at least 10 years ago.  No red streaking, no fever, no chills.    ROS: see HPI    Objective:     There were no vitals taken for this visit.   Physical Exam Comprehensive physical exam not completed today as office visit was conducted remotely.  I am able to see hand on video, does look slightly swollen but no significant redness.  I do see his couple areas of scabbing which are likely from the bite itself.  There is no discharge, no  red streaking, no authorization, no hyperpigmentation.  Patient was alert and oriented, and appeared to have appropriate judgment.   No results found for any visits on 01/17/24.    The 10-year ASCVD risk score (Arnett DK, et al., 2019) is: 11.6%    Assessment & Plan:   Problem List Items Addressed This Visit       Other   Bite, insect - Primary   Acute Appears to be resolving and improving. Per shared decision making she would like to treat with topical antibiotic as opposed to systemic.  I am agreeable to this as other than swelling no obvious clinical signs of infection currently. I did recommend she get updated tetanus vaccine, she can either accomplish this at her pharmacy or she can make a nurses visit appointment tomorrow at my office or her PCPs office to have tetanus vaccine administered.  She reports her understanding. She was told to call office if symptoms worsen especially if new signs of infection occur and educated on what the signs are.  She reports understanding.      Relevant Medications   mupirocin ointment (BACTROBAN) 2 %    Return if symptoms worsen or fail to improve.    Zorita Hiss, NP

## 2024-01-29 ENCOUNTER — Encounter: Payer: Self-pay | Admitting: Family

## 2024-01-29 ENCOUNTER — Telehealth: Payer: Self-pay

## 2024-01-29 ENCOUNTER — Telehealth: Payer: Self-pay | Admitting: Family

## 2024-01-29 ENCOUNTER — Ambulatory Visit (INDEPENDENT_AMBULATORY_CARE_PROVIDER_SITE_OTHER): Admitting: Family

## 2024-01-29 VITALS — BP 140/88 | HR 73 | Temp 97.5°F | Ht 65.0 in | Wt 179.0 lb

## 2024-01-29 DIAGNOSIS — R319 Hematuria, unspecified: Secondary | ICD-10-CM | POA: Diagnosis not present

## 2024-01-29 DIAGNOSIS — R399 Unspecified symptoms and signs involving the genitourinary system: Secondary | ICD-10-CM | POA: Diagnosis not present

## 2024-01-29 LAB — POC URINALSYSI DIPSTICK (AUTOMATED)
Bilirubin, UA: NEGATIVE
Glucose, UA: NEGATIVE
Ketones, UA: NEGATIVE
Nitrite, UA: NEGATIVE
Protein, UA: POSITIVE — AB
Spec Grav, UA: 1.015 (ref 1.010–1.025)
Urobilinogen, UA: 0.2 U/dL
pH, UA: 7.5 (ref 5.0–8.0)

## 2024-01-29 MED ORDER — NITROFURANTOIN MONOHYD MACRO 100 MG PO CAPS: 100.0000 mg | ORAL_CAPSULE | Freq: Two times a day (BID) | ORAL | 0 refills | Status: DC

## 2024-01-29 NOTE — Telephone Encounter (Signed)
 Pt was seen today by Bascom Bossier. Per Eber Goldsmith, pt needs -Repeat urine in 2-3 weeks- results will need to be sent to [facility]. Pt needs to check with spouse about best date, she will call back to schedule.   E2C2, please schedule with pt when she calls back. [facility]

## 2024-01-29 NOTE — Telephone Encounter (Signed)
 Spoke with patient and she was advised that appointment would be needed before medication can be prescribed. She reports since living in [place] didn't want to come this far for a visit and was advised that she could try a different [place] office that is closer to her. Patient reports that she will reach out to the [facility] and [place] office for a visit.   Copied from CRM [id number]. Topic: Clinical - Medication Question >> Jan 29, 2024  8:04 AM Carlatta H wrote: Reason for CRM: Patient would like an antibiotic called in for UTI//Symptoms for about 2 days and she did not want to be seen in the office//Please notify patient via phone call once prescription is called in//Pharmacy to use:[id number] [address] [place] [address] Phone: [phone] Fax: [phone] Hours: Not open 24 hours

## 2024-01-29 NOTE — Patient Instructions (Signed)
 Start macrobid   Ensure to take probiotics while on antibiotics and also for 2 weeks after completion. This can either be by eating yogurt daily or taking a probiotic supplement over the counter such as Culturelle.It is important to re-colonize the gut with good bacteria and also to prevent any diarrheal infections associated with antibiotic use.    Recheck urine in 2-3 weeks to ensure blood has resolved  Urinary Tract Infection, Female A urinary tract infection (UTI) is an infection in your urinary tract. The urinary tract is made up of organs that make, store, and get rid of pee (urine) in your body. These organs include: The kidneys. The ureters. The bladder. The urethra. What are the causes? Most UTIs are caused by germs called bacteria. They may be in or near your genitals. These germs grow and cause swelling in your urinary tract. What increases the risk? You're more likely to get a UTI if: You're a female. The urethra is shorter in females than in males. You have a soft tube called a catheter that drains your pee. You can't control when you pee or poop. You have trouble peeing because of: A kidney stone. A urinary blockage. A nerve condition that affects your bladder. Not getting enough to drink. You're sexually active. You use a birth control inside your vagina, like spermicide. You're pregnant. You have low levels of the hormone estrogen in your body. You're an older adult. You're also more likely to get a UTI if you have other health problems. These may include: Diabetes. A weak immune system. Your immune system is your body's defense system. Sickle cell disease. Injury of the spine. What are the signs or symptoms? Symptoms may include: Needing to pee right away. Peeing small amounts often. Pain or burning when you pee. Blood in your pee. Pee that smells bad or odd. Pain in your belly or lower back. You may also: Feel confused. This may be the first symptom in  older adults. Vomit. Not feel hungry. Feel tired or easily annoyed. Have a fever or chills. How is this diagnosed? A UTI is diagnosed based on your medical history and an exam. You may also have other tests. These may include: Pee tests. Blood tests. Tests for sexually transmitted infections (STIs). If you've had more than one UTI, you may need to have imaging studies done to find out why you keep getting them. How is this treated? A UTI can be treated by: Taking antibiotics or other medicines. Drinking enough fluid to keep your pee pale yellow. In rare cases, a UTI can cause a very bad condition called sepsis. Sepsis may be treated in the hospital. Follow these instructions at home: Medicines Take your medicines only as told by your health care provider. If you were given antibiotics, take them as told by your provider. Do not stop taking them even if you start to feel better. General instructions Make sure you: Pee often and fully. Do not hold your pee for a long time. Wipe from front to back after you pee or poop. Use each tissue only once when you wipe. Pee after you have sex. Do not douche or use sprays or powders in your genital area. Contact a health care provider if: Your symptoms don't get better after 1-2 days of taking antibiotics. Your symptoms go away and then come back. You have a fever or chills. You vomit or feel like you may vomit. Get help right away if: You have very bad pain in your back  or lower belly. You faint. This information is not intended to replace advice given to you by your health care provider. Make sure you discuss any questions you have with your health care provider. Document Revised: 03/29/2023 Document Reviewed: 11/24/2022 Elsevier Patient Education  2024 [employer].

## 2024-01-29 NOTE — Progress Notes (Signed)
 Assessment & Plan:   Hematuria, unspecified type Assessment & Plan: Leukocytes and RBCs present in UA. No systemic features. Counseled on alarm features and to let me know if she were to feel worse.  We agreed to start macrobid ahead of urine culture. Recheck UA in 2-3 weeks to ensure blood resolved; patient will schedule.    Orders: -     Nitrofurantoin Monohyd Macro; Take 1 capsule (100 mg total) by mouth 2 (two) times daily. Take with food.  Dispense: 10 capsule; Refill: 0 -     Urinalysis, Routine w reflex microscopic; Future  UTI symptoms -     POCT Urinalysis Dipstick (Automated) -     Urinalysis, Routine w reflex microscopic -     Urine Culture -     Nitrofurantoin Monohyd Macro; Take 1 capsule (100 mg total) by mouth 2 (two) times daily. Take with food.  Dispense: 10 capsule; Refill: 0 -     Urinalysis, Routine w reflex microscopic; Future     Return precautions given.   Risks, benefits, and alternatives of the medications and treatment plan prescribed today were discussed, and patient expressed understanding.   Education regarding symptom management and diagnosis given to patient on AVS either electronically or printed.  No follow-ups on file.  Bascom Bossier, FNP  Subjective:    Patient ID: Megan Shah, female    DOB: 1956-02-11, 68 y.o.   MRN: [id number]  CC: Fair Jessyca Sloan is a 68 y.o. female who presents today for an acute visit.    HPI: Complains of dysuria, urinary frequency x 1 day She drinking plenty of water.  Endorses dull ache across lower abdomen.  No fever, flank pain, chills, fever     H/o HTN, hypothyroidism No h/o CKD, renal stone    Allergies: Patient has no known allergies. Current Outpatient Medications on File Prior to Visit  Medication Sig Dispense Refill   amLODipine  (NORVASC ) 5 MG tablet Take 1 tablet (5 mg total) by mouth daily. 90 tablet 3   betamethasone  dipropionate 0.05 % lotion Apply topically 2 (two) times  daily. 60 mL 0   Cholecalciferol 50 MCG (2000 UT) TABS Take 4,000 Units by mouth 2 (two) times daily.      DHEA 10 MG CAPS Take by mouth daily.     losartan  (COZAAR ) 50 MG tablet Take 1 tablet (50 mg total) by mouth daily.     Magnesium Citrate POWD Take 615 mg by mouth 2 (two) times daily.     Multiple Vitamins-Minerals (MULTIVITAMIN ADULT PO) Take 1 tablet by mouth daily.     mupirocin  ointment (BACTROBAN ) 2 % Apply 1 Application topically 2 (two) times daily. 22 g 0   Omega-3 Fatty Acids (FISH OIL PO) Take by mouth.     Progesterone  POWD Progesterone  112.5mg - Take 2 capsules by mouth at bedtime 180 g 1   thyroid  (ARMOUR THYROID ) 60 MG tablet TAKE 1 TABLET BY MOUTH DAILY BEFORE MEAL 5 DAYS WEEKLY, TAKE 1/2 TABLET BEFORE MEAL DAILY THE OTHER 2 DAYS 90 tablet 1   TURMERIC PO Take by mouth.     No current facility-administered medications on file prior to visit.    Review of Systems  Constitutional:  Negative for chills and fever.  Respiratory:  Negative for cough.   Cardiovascular:  Negative for chest pain and palpitations.  Gastrointestinal:  Negative for nausea and vomiting.  Genitourinary:  Positive for dysuria and frequency. Negative for difficulty urinating.      Objective:  BP (!) 140/88   Pulse 73   Temp (!) 97.5 F (36.4 C) (Oral)   Ht 5\' 5"  (1.651 m)   Wt 179 lb (81.2 kg)   SpO2 98%   BMI 29.79 kg/m   BP Readings from Last 3 Encounters:  01/29/24 (!) 140/88  09/03/23 120/78  04/25/23 (!) 150/88   Wt Readings from Last 3 Encounters:  01/29/24 179 lb (81.2 kg)  09/03/23 174 lb (78.9 kg)  04/25/23 166 lb (75.3 kg)    Physical Exam Vitals reviewed.  Constitutional:      Appearance: She is well-developed.  Cardiovascular:     Rate and Rhythm: Normal rate and regular rhythm.     Pulses: Normal pulses.     Heart sounds: Normal heart sounds.  Pulmonary:     Effort: Pulmonary effort is normal.     Breath sounds: Normal breath sounds. No wheezing, rhonchi or  rales.  Skin:    General: Skin is warm and dry.  Neurological:     Mental Status: She is alert.  Psychiatric:        Speech: Speech normal.        Behavior: Behavior normal.        Thought Content: Thought content normal.

## 2024-01-29 NOTE — Assessment & Plan Note (Addendum)
 Leukocytes and RBCs present in UA. No systemic features. Counseled on alarm features and to let me know if she were to feel worse.  We agreed to start macrobid ahead of urine culture. Recheck UA in 2-3 weeks to ensure blood resolved; patient will schedule.

## 2024-01-30 LAB — URINALYSIS, ROUTINE W REFLEX MICROSCOPIC
Bilirubin Urine: NEGATIVE
Ketones, ur: NEGATIVE
Nitrite: NEGATIVE
Specific Gravity, Urine: 1.01 (ref 1.000–1.030)
Urine Glucose: NEGATIVE
Urobilinogen, UA: 0.2 (ref 0.0–1.0)
pH: 7.5 (ref 5.0–8.0)

## 2024-01-31 LAB — URINE CULTURE
MICRO NUMBER:: 16502431
SPECIMEN QUALITY:: ADEQUATE

## 2024-02-01 ENCOUNTER — Ambulatory Visit: Payer: Self-pay | Admitting: Family

## 2024-02-19 DIAGNOSIS — M5412 Radiculopathy, cervical region: Secondary | ICD-10-CM | POA: Diagnosis not present

## 2024-02-19 DIAGNOSIS — M9902 Segmental and somatic dysfunction of thoracic region: Secondary | ICD-10-CM | POA: Diagnosis not present

## 2024-02-19 DIAGNOSIS — M546 Pain in thoracic spine: Secondary | ICD-10-CM | POA: Diagnosis not present

## 2024-02-19 DIAGNOSIS — M9901 Segmental and somatic dysfunction of cervical region: Secondary | ICD-10-CM | POA: Diagnosis not present

## 2024-02-22 ENCOUNTER — Other Ambulatory Visit: Payer: Self-pay | Admitting: Family Medicine

## 2024-02-22 NOTE — Telephone Encounter (Unsigned)
 Copied from CRM [id number]. Topic: Clinical - Medication Refill >> Feb 22, 2024  9:43 AM Adonis Hoot wrote: Medication: Progesterone  POWD  Has the patient contacted their pharmacy? Yes (Agent: If no, request that the patient contact the pharmacy for the refill. If patient does not wish to contact the pharmacy document the reason why and proceed with request.) (Agent: If yes, when and what did the pharmacy advise?)  This is the patient's preferred pharmacy:  [facility] [id number] [place], [place] - [address] [place] [address] Phone: [phone] Fax: [phone]  [facility] - [place], [place] - [address] [place] [address] Phone: [phone] Fax: [phone]    Is this the correct pharmacy for this prescription? Yes If no, delete pharmacy and type the correct one.   Has the prescription been filled recently? Yes  Is the patient out of the medication? Yes  Has the patient been seen for an appointment in the last year OR does the patient have an upcoming appointment? Yes  Can we respond through MyChart? Yes  Agent: Please be advised that Rx refills may take up to 3 business days. We ask that you follow-up with your pharmacy.

## 2024-02-25 ENCOUNTER — Telehealth: Payer: Self-pay

## 2024-02-25 MED ORDER — PROGESTERONE POWD: 1 refills | Status: DC

## 2024-02-25 NOTE — Telephone Encounter (Signed)
 Copied from CRM [id number]. Topic: Clinical - Prescription Issue >> Feb 25, 2024  1:04 PM Berneda FALCON wrote: Reason for CRM: Devere from [employer] called to let us  know this medication needs to be sent to a compounding pharmacy.  Progesterone  POWD [phone]

## 2024-02-26 ENCOUNTER — Telehealth: Payer: Self-pay | Admitting: Family Medicine

## 2024-02-26 ENCOUNTER — Encounter: Payer: Self-pay | Admitting: Family Medicine

## 2024-02-26 MED ORDER — PROGESTERONE POWD: 1 refills | Status: AC

## 2024-02-26 NOTE — Telephone Encounter (Signed)
 Medication was send to correct pharmacy [facility].

## 2024-02-26 NOTE — Telephone Encounter (Signed)
 Copied from CRM [id number]. Topic: Clinical - Prescription Issue >> Feb 26, 2024 12:42 PM Gennette ORN wrote: Reason for CRM: [facility] [phone] calling to get verbal consent for Progesterone  POWD.

## 2024-02-26 NOTE — Telephone Encounter (Signed)
 Called patient and no answer phone continued to ring.

## 2024-02-26 NOTE — Telephone Encounter (Signed)
 Rx sent over to [facility].

## 2024-03-11 DIAGNOSIS — M9901 Segmental and somatic dysfunction of cervical region: Secondary | ICD-10-CM | POA: Diagnosis not present

## 2024-03-11 DIAGNOSIS — M9902 Segmental and somatic dysfunction of thoracic region: Secondary | ICD-10-CM | POA: Diagnosis not present

## 2024-03-11 DIAGNOSIS — M5412 Radiculopathy, cervical region: Secondary | ICD-10-CM | POA: Diagnosis not present

## 2024-03-11 DIAGNOSIS — M546 Pain in thoracic spine: Secondary | ICD-10-CM | POA: Diagnosis not present

## 2024-03-12 ENCOUNTER — Encounter: Payer: Self-pay | Admitting: Family Medicine

## 2024-03-12 ENCOUNTER — Telehealth (INDEPENDENT_AMBULATORY_CARE_PROVIDER_SITE_OTHER): Admitting: Family Medicine

## 2024-03-12 VITALS — BP 126/88 | HR 84 | Ht 65.0 in | Wt 179.0 lb

## 2024-03-12 DIAGNOSIS — I10 Essential (primary) hypertension: Secondary | ICD-10-CM | POA: Diagnosis not present

## 2024-03-12 MED ORDER — LOSARTAN POTASSIUM 50 MG PO TABS: 50.0000 mg | ORAL_TABLET | Freq: Every day | ORAL | 1 refills | Status: DC

## 2024-03-12 NOTE — Progress Notes (Signed)
 Discuss need for Losartan , feels like not working  Blood pressure running between 139/82-148/88 at night  No symptoms (CP, HA, palpitations, etc)  BP today 126/88

## 2024-03-12 NOTE — Assessment & Plan Note (Signed)
 Nighttime hypertension persists despite current medication. Considered increasing amlodipine  for nighttime control. Discussed potential side effects, including ankle swelling. Valsartan  as an alternative if daytime hypertension worsens. - Increase amlodipine  to 5 mg twice daily for three weeks. Send message with home BP readings. - Continue losartan  once daily. - Consider switching to valsartan  if daytime readings become elevated.

## 2024-03-12 NOTE — Progress Notes (Signed)
 Virtual Video Visit via MyChart Note  I connected with  Megan Earnie Kudo on 03/12/24 at 10:40 AM EDT by the video enabled telemedicine application for MyChart, and verified that I am speaking with the correct person using two identifiers.   I introduced myself as a Publishing rights manager with the practice. We discussed the limitations of evaluation and management by telemedicine and the availability of in person appointments. The patient expressed understanding and agreed to proceed.  Participating parties in this visit include: The patient and the nurse practitioner listed.  The patient is: At home I am: In the office - [facility]  Subjective:    CC:  Chief Complaint  Patient presents with   Medical Management of Chronic Issues   Hypertension    HPI: Megan Shah is a 68 y.o. year old female presenting today via MyChart today for BP med refills.  Discussed the use of AI scribe software for clinical note transcription with the patient, who gave verbal consent to proceed.  History of Present Illness Megan Shayma Pfefferle is a 69 year old female with hypertension who presents for a medication refill.  She is seeking a refill for her losartan  prescription, as it has been six months since her last office visit. Her blood pressure readings vary, with daytime readings being good, such as 126/88 mmHg, but nighttime readings often increase to 148/88 mmHg or in the 130s/80s.  She currently takes losartan  once daily along with amlodipine  5 mg once daily. She previously took losartan  twice a day without significant improvement, which led to the addition of amlodipine . Despite losing 35 pounds last year, her blood pressure did not improve, and she has since regained 20 pounds, attributing some of her weight changes to stress. She describes her diet as 'pretty clean,' avoiding eating out, fast food, and processed foods.  She denies feeling unwell when her blood  pressure increases. Exercise helps keep her blood pressure down, but the current hot weather limits her outdoor activities.         Past medical history, Surgical history, Family history not pertinant except as noted below, Social history, Allergies, and medications have been entered into the medical record, reviewed, and corrections made.   Review of Systems:  All review of systems negative except what is listed in the HPI   Objective:    General:  Speaking clearly in complete sentences. Absent shortness of breath noted.   Alert and oriented x3.   Normal judgment.  Absent acute distress.   Impression and Recommendations:    Problem List Items Addressed This Visit       Active Problems   Hypertension - Primary   Nighttime hypertension persists despite current medication. Considered increasing amlodipine  for nighttime control. Discussed potential side effects, including ankle swelling. Valsartan  as an alternative if daytime hypertension worsens. - Increase amlodipine  to 5 mg twice daily for three weeks. Send message with home BP readings. - Continue losartan  once daily. - Consider switching to valsartan  if daytime readings become elevated.      Relevant Medications   losartan  (COZAAR ) 50 MG tablet      I discussed the assessment and treatment plan with the patient. The patient was provided an opportunity to ask questions and all were answered. The patient agreed with the plan and demonstrated an understanding of the instructions.   The patient was advised to call back or seek an in-person evaluation if the symptoms worsen or if the condition fails  to improve as anticipated.      Return for reach out with BP readings in 2-3 weeks; due for physical/labs - please schedule .    Waddell KATHEE Mon, NP

## 2024-03-17 DIAGNOSIS — L408 Other psoriasis: Secondary | ICD-10-CM | POA: Diagnosis not present

## 2024-03-19 ENCOUNTER — Ambulatory Visit

## 2024-04-01 ENCOUNTER — Telehealth: Payer: Self-pay

## 2024-04-01 NOTE — Telephone Encounter (Signed)
 Copied from CRM [id number]. Topic: Referral - Request for Referral >> Apr 01, 2024  5:06 PM Chiquita SQUIBB wrote: Did the patient discuss referral with their provider in the last year? Yes (If No - schedule appointment) (If Yes - send message)  Appointment offered? No  Type of order/referral and detailed reason for visit: GI doctor for a colonoscopy that she is due for.  Preference of office, provider, location: [facility] - Dr. Nandigam  If referral order, have you been seen by this specialty before? Yes (If Yes, this issue or another issue? When? Where?  Can we respond through MyChart? No

## 2024-04-02 ENCOUNTER — Other Ambulatory Visit: Payer: Self-pay | Admitting: Family Medicine

## 2024-04-02 ENCOUNTER — Telehealth: Payer: Self-pay | Admitting: Gastroenterology

## 2024-04-02 DIAGNOSIS — Z1211 Encounter for screening for malignant neoplasm of colon: Secondary | ICD-10-CM

## 2024-04-02 NOTE — Telephone Encounter (Signed)
 Good Morning Dr. Shila,  Patient is requesting to be seen for Colonoscopy. Her previous procedure took place in [place]  in 2022, Patient would like to transfer care due to living in local proximity. Patients records are available in EPIC under media for you to review. Please advise further on scheduling.  Thank you

## 2024-04-02 NOTE — Telephone Encounter (Signed)
 Is it ok to place referral? Patient had a virtual with Waddell Almarie PIETY.

## 2024-04-04 DIAGNOSIS — M5412 Radiculopathy, cervical region: Secondary | ICD-10-CM | POA: Diagnosis not present

## 2024-04-04 DIAGNOSIS — M9901 Segmental and somatic dysfunction of cervical region: Secondary | ICD-10-CM | POA: Diagnosis not present

## 2024-04-04 DIAGNOSIS — M9902 Segmental and somatic dysfunction of thoracic region: Secondary | ICD-10-CM | POA: Diagnosis not present

## 2024-04-04 DIAGNOSIS — M546 Pain in thoracic spine: Secondary | ICD-10-CM | POA: Diagnosis not present

## 2024-04-04 NOTE — Telephone Encounter (Signed)
 Ok to schedule.

## 2024-04-08 ENCOUNTER — Encounter: Payer: Self-pay | Admitting: Gastroenterology

## 2024-05-02 ENCOUNTER — Other Ambulatory Visit: Payer: Self-pay | Admitting: Family Medicine

## 2024-05-12 ENCOUNTER — Telehealth: Payer: Self-pay | Admitting: *Deleted

## 2024-05-12 ENCOUNTER — Ambulatory Visit (INDEPENDENT_AMBULATORY_CARE_PROVIDER_SITE_OTHER): Admitting: *Deleted

## 2024-05-12 VITALS — Ht 65.0 in | Wt 183.0 lb

## 2024-05-12 DIAGNOSIS — Z0000 Encounter for general adult medical examination without abnormal findings: Secondary | ICD-10-CM | POA: Diagnosis not present

## 2024-05-12 NOTE — Progress Notes (Signed)
 Please attest this visit in the absence of patient primary care provider.    Subjective:   Megan Shah is a 68 y.o. who presents for a Medicare Wellness preventive visit.  As a reminder, Annual Wellness Visits don't include a physical exam, and some assessments may be limited, especially if this visit is performed virtually. We may recommend an in-person follow-up visit with your provider if needed.  Visit Complete: Virtual I connected with  Megan Shah on 05/12/24 by a audio enabled telemedicine application and verified that I am speaking with the correct person using two identifiers.  Patient Location: Home  Provider Location: Office/Clinic  I discussed the limitations of evaluation and management by telemedicine. The patient expressed understanding and agreed to proceed.  Vital Signs: Because this visit was a virtual/telehealth visit, some criteria may be missing or patient reported. Any vitals not documented were not able to be obtained and vitals that have been documented are patient reported.  VideoDeclined- This patient declined Librarian, academic. Therefore the visit was completed with audio only.  Persons Participating in Visit: Patient.  AWV Questionnaire: No: Patient Medicare AWV questionnaire was not completed prior to this visit.  Cardiac Risk Factors include: advanced age (>40men, >17 women);hypertension;dyslipidemia     Objective:    Today's Vitals   05/12/24 1502  Weight: 183 lb (83 kg)  Height: 5' 5 (1.651 m)   Body mass index is 30.45 kg/m.     05/12/2024    3:22 PM 03/12/2023    8:11 AM 03/07/2022   10:27 AM 03/05/2022    1:51 PM  Advanced Directives  Does Patient Have a Medical Advance Directive? Yes Yes No No  Type of Estate agent of [employer] Power of [place];Living will    Does patient want to make changes to medical advance directive? No - Patient declined No - Patient declined     Copy of Healthcare Power of Attorney in Chart? Yes - validated most recent copy scanned in chart (See row information) Yes - validated most recent copy scanned in chart (See row information)    Would patient like information on creating a medical advance directive?   No - Patient declined     Current Medications (verified) Outpatient Encounter Medications as of 05/12/2024  Medication Sig   amLODipine  (NORVASC ) 5 MG tablet Take 1 tablet (5 mg total) by mouth daily.   betamethasone  dipropionate 0.05 % lotion Apply topically 2 (two) times daily.   Cholecalciferol 50 MCG (2000 UT) TABS Take 4,000 Units by mouth 2 (two) times daily.    DHEA 10 MG CAPS Take by mouth daily.   losartan  (COZAAR ) 50 MG tablet Take 1 tablet (50 mg total) by mouth daily.   Magnesium Citrate POWD Take 615 mg by mouth 2 (two) times daily.   Multiple Vitamins-Minerals (MULTIVITAMIN ADULT PO) Take 1 tablet by mouth daily.   mupirocin  ointment (BACTROBAN ) 2 % Apply 1 Application topically 2 (two) times daily.   Omega-3 Fatty Acids (FISH OIL PO) Take by mouth.   Progesterone  POWD Progesterone  112.5mg - Take 2 capsules by mouth at bedtime   thyroid  (ARMOUR THYROID ) 60 MG tablet TAKE 1 TABLET DAILY BEFORE A MEAL 5 DAYS A WEEK, AND 1/2 TABLET BEFORE A MEAL THE OTHER 2 DAYS   TURMERIC PO Take by mouth.   No facility-administered encounter medications on file as of 05/12/2024.    Allergies (verified) Patient has no known allergies.   History: 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   Bunion of great toe of right foot 02/07/2016   Diverticulitis    History of chicken pox 06/07/2015   Hyperlipidemia    Hypertension    Left shoulder pain 12/25/2016   Overweight 06/07/2015   Palpitations 06/07/2015   Patient is Jehovah's Witness 06/07/2015   Post menopausal syndrome 06/13/2015   Preventative health care 12/25/2016   Right hip pain 06/13/2015   Thyroid  disease    Transfusion of blood product refused  for religious reason 06/07/2015   Vitamin D  deficiency 06/07/2015   Past Surgical History:  Procedure Laterality Date   PARTIAL HIP ARTHROPLASTY     WISDOM TOOTH EXTRACTION  Age 30   Family History  Problem Relation Age of Onset   Arrhythmia Mother    Arthritis Mother    Hypertension Mother    Hyperlipidemia Mother    Heart disease Mother        CAD with stents, afib   Atrial fibrillation Mother        flutter   Arrhythmia Father    Arthritis Father        broken hip, sepsis    Cancer Father        colon cancer, mirkel cell carcinoma   Bunion Father    Dementia Father    Atrial fibrillation Father    Cancer Brother        kidney   Colon polyps Brother        non cancer polyp   Cancer Brother        lymphoma in remission   Atrial fibrillation Brother    Arthritis Maternal Grandmother    Arthritis Maternal Grandfather    Cancer Maternal Grandfather 47       colon cancer   Arthritis Paternal Grandmother    Arthritis Paternal Grandfather    Cancer Paternal Grandfather        colon cancer   Social History   Socioeconomic History   Marital status: Married    Spouse name: Not on file   Number of children: 0   Years of education: Not on file   Highest education level: 12th grade  Occupational History   Occupation: Agricultural consultant   Occupation: Retired Education officer, environmental business  Tobacco Use   Smoking status: Former   Smokeless tobacco: Never   Tobacco comments:    Smoked for 2 years as a teenager.  Vaping Use   Vaping status: Never Used  Substance and Sexual Activity   Alcohol use: Yes    Comment: 1-2 drinks a week   Drug use: No   Sexual activity: Yes    Comment: lives with husband, moved from [place] , no dietary restrictions, minimizes wheat and dairy, volunteer work  Other Topics Concern   Not on file  Social History Narrative   Not on file   Social Drivers of Health   Financial Resource Strain: Low Risk  (05/12/2024)   Overall Financial Resource Strain (CARDIA)     Difficulty of Paying Living Expenses: Not very hard  Food Insecurity: No Food Insecurity (05/12/2024)   Hunger Vital Sign    Worried About Running Out of Food in the Last Year: Never true    Ran Out of Food in the Last Year: Never true  Transportation Needs: No Transportation Needs (05/12/2024)   PRAPARE - Administrator, Civil Service (Medical): No    Lack of Transportation (Non-Medical): No  Physical Activity: Inactive (05/12/2024)   Exercise Vital Sign  Days of Exercise per Week: 0 days    Minutes of Exercise per Session: 0 min  Stress: No Stress Concern Present (05/12/2024)   [employer] of Occupational Health - Occupational Stress Questionnaire    Feeling of Stress: Not at all  Social Connections: Socially Integrated (05/12/2024)   Social Connection and Isolation Panel    Frequency of Communication with Friends and Family: More than three times a week    Frequency of Social Gatherings with Friends and Family: More than three times a week    Attends Religious Services: More than 4 times per year    Active Member of [employer] or Organizations: No    Attends Engineer, structural: More than 4 times per year    Marital Status: Married    Tobacco Counseling Counseling given: Not Answered Tobacco comments: Smoked for 2 years as a teenager.    Clinical Intake:  Pre-visit preparation completed: Yes  Pain : No/denies pain     BMI - recorded: 30.45 Nutritional Status: BMI 25 -29 Overweight Nutritional Risks: None Diabetes: No  Lab Results  Component Value Date   HGBA1C 5.4 07/02/2023   HGBA1C 5.4 01/01/2023   HGBA1C 5.7 06/13/2022     How often do you need to have someone help you when you read instructions, pamphlets, or other written materials from your doctor or pharmacy?: 1 - Never  Interpreter Needed?: No  Information entered by :: Shalaunda Weatherholtz, CMA(AAMA)   Activities of Daily Living     05/12/2024    3:45 PM  In your present state of  health, do you have any difficulty performing the following activities:  Hearing? 1  Vision? 0  Difficulty concentrating or making decisions? 0  Walking or climbing stairs? 0  Dressing or bathing? 0  Doing errands, shopping? 0  Preparing Food and eating ? N  Using the Toilet? N  In the past six months, have you accidently leaked urine? N  Do you have problems with loss of bowel control? N  Managing your Medications? N  Managing your Finances? N  Housekeeping or managing your Housekeeping? N    Patient Care Team: Domenica Harlene LABOR, MD as PCP - General (Family Medicine) Orlie Norris, MD as Referring Physician (Family Medicine) Pa, [facility] (Optometry)  I have updated your Care Teams any recent Medical Services you may have received from other providers in the past year.     Assessment:   This is a routine wellness examination for Megan Shah.  Hearing/Vision screen Hearing Screening - Comments:: Has some decrease but hearing aids not needed at this time. Vision Screening - Comments:: Wears RX glasses -- up to date with routine [facility] exams at [facility].    Goals Addressed               This Visit's Progress     Patient Stated (pt-stated)        To restart exercise to twice a week, 30 min each day.       Depression Screen     05/12/2024    3:18 PM 03/12/2023    8:33 AM 08/31/2022    2:17 PM 07/14/2022    3:04 PM 04/24/2022    1:59 PM 02/23/2022    8:23 AM  PHQ 2/9 Scores  PHQ - 2 Score 1 0 1 0 0 0  PHQ- 9 Score 3         Fall Risk     05/12/2024    3:14  PM 03/12/2023    8:03 AM 07/14/2022    3:04 PM 04/24/2022    1:59 PM 02/23/2022    8:19 AM  Fall Risk   Falls in the past year? 0 0 0 0 0  Number falls in past yr: 0 0 0 0 0  Injury with Fall? 0 0 0 0 0  Risk for fall due to :  No Fall Risks No Fall Risks  No Fall Risks  Follow up Education provided Falls evaluation completed Falls evaluation completed  Falls evaluation completed  Falls evaluation  completed      Data saved with a previous flowsheet row definition    MEDICARE RISK AT HOME:  Medicare Risk at Home Any stairs in or around the home?: Yes If so, are there any without handrails?: No Home free of loose throw rugs in walkways, pet beds, electrical cords, etc?: Yes Adequate lighting in your home to reduce risk of falls?: Yes Life alert?: No Use of a cane, walker or w/c?: No Grab bars in the bathroom?: Yes Shower chair or bench in shower?: Yes (not needed) Elevated toilet seat or a handicapped toilet?: No  TIMED UP AND GO:  Was the test performed?  No,audio  Cognitive Function: 6CIT completed        05/12/2024    3:23 PM 03/12/2023    8:35 AM  6CIT Screen  What Year? 0 points 0 points  What month? 0 points 0 points  What time? 0 points 0 points  Count back from 20 0 points 0 points  Months in reverse 0 points 0 points  Repeat phrase 0 points 0 points  Total Score 0 points 0 points    Immunizations Immunization History  Administered Date(s) Administered   Hepatitis A, Adult 12/21/2014   Hepatitis B, ADULT 12/21/2014, 02/02/2015   PFIZER(Purple Top)SARS-COV-2 Vaccination 02/07/2020, 02/28/2020   Pfizer Covid-19 Vaccine Bivalent Booster 89yrs & up 10/21/2021   Tdap 01/21/2024    Screening Tests Health Maintenance  Topic Date Due   Zoster Vaccines- Shingrix (1 of 2) Never done   Pneumococcal Vaccine: 50+ Years (1 of 1 - PCV) Never done   Colonoscopy  09/05/2023   COVID-19 Vaccine (4 - 2025-26 season) 05/05/2024   Influenza Vaccine  12/02/2024 (Originally 04/04/2024)   Medicare Annual Wellness (AWV)  05/12/2025   MAMMOGRAM  09/26/2025   DTaP/Tdap/Td (2 - Td or Tdap) 01/20/2034   DEXA SCAN  Completed   Hepatitis C Screening  Completed   HPV VACCINES  Aged Out   Meningococcal B Vaccine  Aged Out   Hepatitis B Vaccines 19-59 Average Risk  Discontinued    Health Maintenance Items Addressed: Still considering shingles and pneumonia vaccines. Has  colonoscopy scheduled for 05/19/24.  Additional Screening:  Vision Screening: Recommended annual ophthalmology exams for early detection of glaucoma and other disorders of the eye. Is the patient up to date with their annual eye exam?  Yes  Who is the provider or what is the name of the office in which the patient attends annual eye exams? [facility]  Dental Screening: Recommended annual dental exams for proper oral hygiene  Community Resource Referral / Chronic Care Management: CRR required this visit?  No   CCM required this visit?  No   Plan:    I have personally reviewed and noted the following in the patient's chart:   Medical and social history Use of alcohol, tobacco or illicit drugs  Current medications and supplements including opioid prescriptions. Patient  is not currently taking opioid prescriptions. Functional ability and status Nutritional status Physical activity Advanced directives List of other physicians Hospitalizations, surgeries, and ER visits in previous 12 months Vitals Screenings to include cognitive, depression, and falls Referrals and appointments  In addition, I have reviewed and discussed with patient certain preventive protocols, quality metrics, and best practice recommendations. A written personalized care plan for preventive services as well as general preventive health recommendations were provided to patient.   Lolita Libra, CMA   05/12/2024   After Visit Summary: (MyChart) Due to this being a telephonic visit, the after visit summary with patients personalized plan was offered to patient via MyChart   Notes: Nothing significant to report at this time.

## 2024-05-12 NOTE — Patient Instructions (Signed)
 Megan Shah , Thank you for taking time out of your busy schedule to complete your Annual Wellness Visit with me. I enjoyed our conversation and look forward to speaking with you again next year. I, as well as your care team,  appreciate your ongoing commitment to your health goals. Please review the following plan we discussed and let me know if I can assist you in the future. Your Game plan/ To Do List   Referrals: If you haven't heard from the office you've been referred to, please reach out to them at the phone provided.   Colonoscopy (if you need to reschedule):  [phone]  Follow up Visits: Next Medicare AWV with our clinical staff: 05/15/25 3pm, telephone.   Next Office Visit with your provider: 06/20/24 2pm, Megan Jolly, NP.   **Please call for an earlier appointment if your leg pain returns or if swelling in your feet worsens.**  Clinician Recommendations:  Aim for 30 minutes of exercise or brisk walking, 6-8 glasses of water, and 5 servings of fruits and vegetables each day.   If you change your mind, shingles and pneumonia vaccines can be gotten at your pharmacy.     This is a list of the screening recommended for you and due dates:  Health Maintenance  Topic Date Due   Zoster (Shingles) Vaccine (1 of 2) Never done   Pneumococcal Vaccine for age over 67 (1 of 1 - PCV) Never done   Colon Cancer Screening  09/05/2023   Medicare Annual Wellness Visit  03/11/2024   Flu Shot  Never done   COVID-19 Vaccine (4 - 2025-26 season) 05/05/2024   Mammogram  09/26/2025   DTaP/Tdap/Td vaccine (2 - Td or Tdap) 01/20/2034   DEXA scan (bone density measurement)  Completed   Hepatitis C Screening  Completed   HPV Vaccine  Aged Out   Meningitis B Vaccine  Aged Out   Hepatitis B Vaccine  Discontinued    Advanced directives: (In Chart) A copy of your advanced directives are scanned into your chart should your provider ever need it. Advance Care Planning is important because  it:  [x]  Makes sure you receive the medical care that is consistent with your values, goals, and preferences  [x]  It provides guidance to your family and loved ones and reduces their decisional burden about whether or not they are making the right decisions based on your wishes.  Follow the link provided in your after visit summary or read over the paperwork we have mailed to you to help you started getting your Advance Directives in place. If you need assistance in completing these, please reach out to us  so that we can help you!  See attachments for Preventive Care and Fall Prevention Tips.

## 2024-05-12 NOTE — Telephone Encounter (Signed)
 Pt had AWV today. Initially reported concerns about potential risk of taking progesterone  with her family hx of blood clots in the legs.  Mentions developing leg pain after traveling to [place]  recently and started taking ASA 81mg  at that time. Reports pain resolved and developed no other symptoms.  Also notes some swelling in both feet since starting Amlodipine .  Advised pt to schedule OV for further evaluation. Pt declines until 06/20/24 at 2pm with Harlene Jolly.  Advised pt to call for earlier appt if leg pain returns or swelling of feet worsens and she voices understanding.

## 2024-05-13 NOTE — Telephone Encounter (Signed)
 Left message for pt to check mychart message. Sent PCP response via mychart.

## 2024-05-19 ENCOUNTER — Encounter: Payer: Self-pay | Admitting: Gastroenterology

## 2024-05-19 ENCOUNTER — Ambulatory Visit (AMBULATORY_SURGERY_CENTER)

## 2024-05-19 VITALS — Ht 65.0 in

## 2024-05-19 DIAGNOSIS — Z860100 Personal history of colon polyps, unspecified: Secondary | ICD-10-CM

## 2024-05-19 DIAGNOSIS — Z800 Family history of malignant neoplasm of digestive organs: Secondary | ICD-10-CM

## 2024-05-19 MED ORDER — NA SULFATE-K SULFATE-MG SULF 17.5-3.13-1.6 GM/177ML PO SOLN: 1.0000 | Freq: Once | ORAL | 0 refills | Status: AC

## 2024-05-19 MED ORDER — ONDANSETRON HCL 4 MG PO TABS: 4.0000 mg | ORAL_TABLET | ORAL | 0 refills | Status: AC

## 2024-05-19 NOTE — Progress Notes (Signed)

## 2024-05-22 DIAGNOSIS — M546 Pain in thoracic spine: Secondary | ICD-10-CM | POA: Diagnosis not present

## 2024-05-22 DIAGNOSIS — M9902 Segmental and somatic dysfunction of thoracic region: Secondary | ICD-10-CM | POA: Diagnosis not present

## 2024-05-22 DIAGNOSIS — M5412 Radiculopathy, cervical region: Secondary | ICD-10-CM | POA: Diagnosis not present

## 2024-05-22 DIAGNOSIS — M9901 Segmental and somatic dysfunction of cervical region: Secondary | ICD-10-CM | POA: Diagnosis not present

## 2024-06-06 ENCOUNTER — Encounter: Payer: Self-pay | Admitting: Gastroenterology

## 2024-06-06 ENCOUNTER — Ambulatory Visit: Admitting: Gastroenterology

## 2024-06-06 VITALS — BP 120/69 | HR 62 | Temp 97.3°F | Resp 13 | Ht 65.0 in | Wt 180.0 lb

## 2024-06-06 DIAGNOSIS — Z1211 Encounter for screening for malignant neoplasm of colon: Secondary | ICD-10-CM | POA: Diagnosis not present

## 2024-06-06 DIAGNOSIS — K648 Other hemorrhoids: Secondary | ICD-10-CM | POA: Diagnosis not present

## 2024-06-06 DIAGNOSIS — E7849 Other hyperlipidemia: Secondary | ICD-10-CM | POA: Diagnosis not present

## 2024-06-06 DIAGNOSIS — K5730 Diverticulosis of large intestine without perforation or abscess without bleeding: Secondary | ICD-10-CM | POA: Diagnosis not present

## 2024-06-06 DIAGNOSIS — Z860101 Personal history of adenomatous and serrated colon polyps: Secondary | ICD-10-CM

## 2024-06-06 DIAGNOSIS — K644 Residual hemorrhoidal skin tags: Secondary | ICD-10-CM | POA: Diagnosis not present

## 2024-06-06 DIAGNOSIS — D122 Benign neoplasm of ascending colon: Secondary | ICD-10-CM

## 2024-06-06 DIAGNOSIS — Z800 Family history of malignant neoplasm of digestive organs: Secondary | ICD-10-CM

## 2024-06-06 DIAGNOSIS — Z860100 Personal history of colon polyps, unspecified: Secondary | ICD-10-CM

## 2024-06-06 MED ORDER — SODIUM CHLORIDE 0.9 % IV SOLN: 500.0000 mL | Freq: Once | INTRAVENOUS | Status: DC

## 2024-06-06 NOTE — Patient Instructions (Signed)
 Read all of your discharge instructions. Resume all of your previous medications today as ordered. Biopsy results will be back in about two weeks.   YOU HAD AN ENDOSCOPIC PROCEDURE TODAY AT THE [facility]:   Refer to the procedure report that was given to you for any specific questions about what was found during the examination.  If the procedure report does not answer your questions, please call your gastroenterologist to clarify.  If you requested that your care partner not be given the details of your procedure findings, then the procedure report has been included in a sealed envelope for you to review at your convenience later.  YOU SHOULD EXPECT: Some feelings of bloating in the abdomen. Passage of more gas than usual.  Walking can help get rid of the air that was put into your GI tract during the procedure and reduce the bloating. If you had a lower endoscopy (such as a colonoscopy or flexible sigmoidoscopy) you may notice spotting of blood in your stool or on the toilet paper. If you underwent a bowel prep for your procedure, you may not have a normal bowel movement for a few days.  Please Note:  You might notice some irritation and congestion in your nose or some drainage.  This is from the oxygen used during your procedure.  There is no need for concern and it should clear up in a day or so.  SYMPTOMS TO REPORT IMMEDIATELY:  Following lower endoscopy (colonoscopy or flexible sigmoidoscopy):  Excessive amounts of blood in the stool  Significant tenderness or worsening of abdominal pains  Swelling of the abdomen that is new, acute  Fever of 100F or higher   For urgent or emergent issues, a gastroenterologist can be reached at any hour by calling [phone]. Do not use MyChart messaging for urgent concerns.    DIET:  We do recommend a small meal at first, but then you may proceed to your regular diet.  Drink plenty of fluids but you should avoid alcoholic beverages  for 24 hours.  ACTIVITY:  You should plan to take it easy for the rest of today and you should NOT DRIVE or use heavy machinery until tomorrow (because of the sedation medicines used during the test).    FOLLOW UP: Our staff will call the number listed on your records the next business day following your procedure.  We will call around 7:15- 8:00 am to check on you and address any questions or concerns that you may have regarding the information given to you following your procedure. If we do not reach you, we will leave a message.     If any biopsies were taken you will be contacted by phone or by letter within the next 1-3 weeks.  Please call us  at [phone] if you have not heard about the biopsies in 3 weeks.    SIGNATURES/CONFIDENTIALITY: You and/or your care partner have signed paperwork which will be entered into your electronic medical record.  These signatures attest to the fact that that the information above on your After Visit Summary has been reviewed and is understood.  Full responsibility of the confidentiality of this discharge information lies with you and/or your care-partner.

## 2024-06-06 NOTE — Progress Notes (Signed)
 Pt's states no medical or surgical changes since previsit or office visit.

## 2024-06-06 NOTE — Progress Notes (Signed)
 Called to room to assist during endoscopic procedure.  Patient ID and intended procedure confirmed with present staff. Received instructions for my participation in the procedure from the performing physician.

## 2024-06-06 NOTE — Op Note (Signed)
 [facility] Patient Name: Megan Shah Procedure Date: 06/06/2024 10:30 AM MRN: [id number] Endoscopist: Gustav ALONSO Mcgee , MD, [id number] Age: 68 Referring MD:  Date of Birth: [birth year] Gender: Female Account #: [id number] Procedure:                Colonoscopy Indications:              Screening in patient at increased risk: Colorectal                            cancer in mother 57 or older, Screening in patient                            at increased risk: Colorectal cancer in father 35                            or older, High risk colon cancer surveillance:                            Personal history of colonic polyps Medicines:                Monitored Anesthesia Care Procedure:                Pre-Anesthesia Assessment:                           - Prior to the procedure, a History and Physical                            was performed, and patient medications and                            allergies were reviewed. The patient's tolerance of                            previous anesthesia was also reviewed. The risks                            and benefits of the procedure and the sedation                            options and risks were discussed with the patient.                            All questions were answered, and informed consent                            was obtained. Prior Anticoagulants: The patient has                            taken no anticoagulant or antiplatelet agents. ASA                            Grade Assessment: II - A patient with mild systemic  disease. After reviewing the risks and benefits,                            the patient was deemed in satisfactory condition to                            undergo the procedure.                           After obtaining informed consent, the colonoscope                            was passed under direct vision. Throughout the                            procedure, the  patient's blood pressure, pulse, and                            oxygen saturations were monitored continuously. The                            PCF-HQ190L Colonoscope [id number] was introduced                            through the anus and advanced to the the cecum,                            identified by appendiceal orifice and ileocecal                            valve. The colonoscopy was performed without                            difficulty. The patient tolerated the procedure                            well. The quality of the bowel preparation was                            good. The ileocecal valve, appendiceal orifice, and                            rectum were photographed. Scope In: 10:46:08 AM Scope Out: 11:00:36 AM Scope Withdrawal Time: 0 hours 10 minutes 36 seconds  Total Procedure Duration: 0 hours 14 minutes 28 seconds  Findings:                 The perianal and digital rectal examinations were                            normal.                           A 5 mm polyp was found in the ascending colon. The  polyp was sessile. The polyp was removed with a                            cold snare. Resection and retrieval were complete.                           Scattered large-mouthed, medium-mouthed and                            small-mouthed diverticula were found in the sigmoid                            colon and descending colon.                           Non-bleeding external and internal hemorrhoids were                            found during retroflexion. The hemorrhoids were                            medium-sized. Complications:            No immediate complications. Estimated Blood Loss:     Estimated blood loss was minimal. Impression:               - One 5 mm polyp in the ascending colon, removed                            with a cold snare. Resected and retrieved.                           - Diverticulosis in the sigmoid colon and in the                             descending colon.                           - Non-bleeding external and internal hemorrhoids. Recommendation:           - Resume previous diet.                           - Continue present medications.                           - Await pathology results.                           - Repeat colonoscopy in 5 years for surveillance                            based on pathology results. Jazsmin Couse V. Lareina Espino, MD 06/06/2024 11:10:48 AM This report has been signed electronically.

## 2024-06-06 NOTE — Progress Notes (Signed)
 Transferred to PACU via stretcher, arousing, VSS.

## 2024-06-06 NOTE — Progress Notes (Signed)
 [facility] History and Physical   Primary Care Physician:  Domenica Harlene LABOR, MD   Reason for Procedure:  History of adenomatous colon polyps, family h/o colon cancer  Plan:    Surveillance colonoscopy with possible interventions as needed     HPI: [name] is a very pleasant 68 y.o. female here for surveillance colonoscopy. Denies any nausea, vomiting, abdominal pain, melena or bright red blood per rectum  The risks and benefits as well as alternatives of endoscopic procedure(s) have been discussed and reviewed. All questions answered. The patient agrees to proceed.    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   Bunion of great toe of right foot 02/07/2016   Diverticulitis    History of chicken pox 06/07/2015   Hyperlipidemia    Hypertension    Left shoulder pain 12/25/2016   Overweight 06/07/2015   Palpitations 06/07/2015   Patient is Jehovah's Witness 06/07/2015   Post menopausal syndrome 06/13/2015   Preventative health care 12/25/2016   Right hip pain 06/13/2015   Thyroid  disease    Transfusion of blood product refused for religious reason 06/07/2015   Vitamin D  deficiency 06/07/2015    Past Surgical History:  Procedure Laterality Date   COLONOSCOPY     PARTIAL HIP ARTHROPLASTY     WISDOM TOOTH EXTRACTION  Age 23    Prior to Admission medications   Medication Sig Start Date End Date Taking? Authorizing Provider  amLODipine  (NORVASC ) 5 MG tablet Take 1 tablet (5 mg total) by mouth daily. 09/03/23  Yes Saguier, Edward, PA-C  Cholecalciferol 50 MCG (2000 UT) TABS Take 4,000 Units by mouth 2 (two) times daily.    Yes [provider]  DHEA 10 MG CAPS Take by mouth daily.   Yes [provider]  losartan  (COZAAR ) 50 MG tablet Take 1 tablet (50 mg total) by mouth daily. 03/12/24  Yes Almarie Waddell NOVAK, NP  Magnesium Citrate POWD Take 615 mg by mouth 2 (two) times daily.   Yes [provider]  Multiple  Vitamins-Minerals (MULTIVITAMIN ADULT PO) Take 1 tablet by mouth daily.   Yes [provider]  Omega-3 Fatty Acids (FISH OIL PO) Take by mouth.   Yes [provider]  ondansetron  (ZOFRAN ) 4 MG tablet Take 1 tablet (4 mg total) by mouth as directed. Take one Zofran  4 mg tablet 30-60 minutes before each colonoscopy prep dose 05/19/24  Yes Jessica Checketts V, MD  Progesterone  POWD Progesterone  112.5mg - Take 2 capsules by mouth at bedtime 02/26/24  Yes Domenica Harlene LABOR, MD  thyroid  (ARMOUR THYROID ) 60 MG tablet TAKE 1 TABLET DAILY BEFORE A MEAL 5 DAYS A WEEK, AND 1/2 TABLET BEFORE A MEAL THE OTHER 2 DAYS 05/06/24  Yes Domenica Harlene LABOR, MD  betamethasone  dipropionate 0.05 % lotion Apply topically 2 (two) times daily. 09/01/21   Domenica Harlene LABOR, MD  Fluocinolone Acetonide 0.01 % OIL several drops to affected area of the scalp bid x 2 weeks scalp Twice a day; Duration: 14 days bid x 2 weeks then prn 03/17/24   [provider]  ketoconazole (NIZORAL) 2 % shampoo as directed Externally daily; Duration: 30 days wash scalp daily 03/17/24 08/14/24  [provider]  mupirocin  ointment (BACTROBAN ) 2 % Apply 1 Application topically 2 (two) times daily. Patient not taking: No sig reported 01/17/24   Elnor Lauraine BRAVO, NP  TURMERIC PO Take by mouth.    [provider]    Current Outpatient Medications  Medication Sig Dispense Refill   amLODipine  (NORVASC ) 5 MG tablet Take 1 tablet (5 mg total) by mouth daily. 90 tablet 3   Cholecalciferol 50 MCG (2000 UT) TABS Take 4,000 Units by mouth 2 (two) times daily.      DHEA 10 MG CAPS Take by mouth daily.     losartan  (COZAAR ) 50 MG tablet Take 1 tablet (50 mg total) by mouth daily. 90 tablet 1   Magnesium Citrate POWD Take 615 mg by mouth 2 (two) times daily.     Multiple Vitamins-Minerals (MULTIVITAMIN ADULT PO) Take 1 tablet by mouth daily.     Omega-3 Fatty Acids (FISH OIL PO) Take by mouth.     ondansetron  (ZOFRAN ) 4 MG tablet  Take 1 tablet (4 mg total) by mouth as directed. Take one Zofran  4 mg tablet 30-60 minutes before each colonoscopy prep dose 4 tablet 0   Progesterone  POWD Progesterone  112.5mg - Take 2 capsules by mouth at bedtime 180 g 1   thyroid  (ARMOUR THYROID ) 60 MG tablet TAKE 1 TABLET DAILY BEFORE A MEAL 5 DAYS A WEEK, AND 1/2 TABLET BEFORE A MEAL THE OTHER 2 DAYS 90 tablet 1   betamethasone  dipropionate 0.05 % lotion Apply topically 2 (two) times daily. 60 mL 0   Fluocinolone Acetonide 0.01 % OIL several drops to affected area of the scalp bid x 2 weeks scalp Twice a day; Duration: 14 days bid x 2 weeks then prn     ketoconazole (NIZORAL) 2 % shampoo as directed Externally daily; Duration: 30 days wash scalp daily     mupirocin  ointment (BACTROBAN ) 2 % Apply 1 Application topically 2 (two) times daily. (Patient not taking: No sig reported) 22 g 0   TURMERIC PO Take by mouth.     Current Facility-Administered Medications  Medication Dose Route Frequency Provider Last Rate Last Admin   0.9 %  sodium chloride infusion  500 mL Intravenous Once Vincente Asbridge V, MD        Allergies as of 06/06/2024   (No Known Allergies)    Family History  Problem Relation Age of Onset   Colon polyps Mother    Colon cancer Mother    Arrhythmia Mother    Arthritis Mother    Hypertension Mother    Hyperlipidemia Mother    Heart disease Mother        CAD with stents, afib   Atrial fibrillation Mother        flutter   Colon polyps Father    Colon cancer Father    Arrhythmia Father    Arthritis Father        broken hip, sepsis    Cancer Father        colon cancer, mirkel cell carcinoma   Bunion Father    Dementia Father    Atrial fibrillation Father    Cancer Brother        kidney   Colon polyps Brother        non cancer polyp   Cancer Brother        lymphoma in remission   Atrial fibrillation Brother    Arthritis Maternal Grandmother    Colon polyps Maternal Grandfather    Colon cancer Maternal  Grandfather    Arthritis Maternal Grandfather    Cancer Maternal Grandfather 35       colon cancer   Arthritis Paternal Grandmother    Arthritis Paternal Grandfather    Cancer Paternal Grandfather        colon cancer  Esophageal cancer Neg Hx    Rectal cancer Neg Hx    Stomach cancer Neg Hx     Social History   Socioeconomic History   Marital status: Married    Spouse name: Not on file   Number of children: 0   Years of education: Not on file   Highest education level: 12th grade  Occupational History   Occupation: Agricultural consultant   Occupation: Retired Education officer, environmental business  Tobacco Use   Smoking status: Former   Smokeless tobacco: Never   Tobacco comments:    Smoked for 2 years as a teenager.  Vaping Use   Vaping status: Never Used  Substance and Sexual Activity   Alcohol use: Yes    Comment: 1-2 drinks a week   Drug use: No   Sexual activity: Yes    Comment: lives with husband, moved from [place] , no dietary restrictions, minimizes wheat and dairy, volunteer work  Other Topics Concern   Not on file  Social History Narrative   Not on file   Social Drivers of Health   Financial Resource Strain: Low Risk  (05/12/2024)   Overall Financial Resource Strain (CARDIA)    Difficulty of Paying Living Expenses: Not very hard  Food Insecurity: No Food Insecurity (05/12/2024)   Hunger Vital Sign    Worried About Running Out of Food in the Last Year: Never true    Ran Out of Food in the Last Year: Never true  Transportation Needs: No Transportation Needs (05/12/2024)   PRAPARE - Administrator, Civil Service (Medical): No    Lack of Transportation (Non-Medical): No  Physical Activity: Inactive (05/12/2024)   Exercise Vital Sign    Days of Exercise per Week: 0 days    Minutes of Exercise per Session: 0 min  Stress: No Stress Concern Present (05/12/2024)   [employer] of Occupational Health - Occupational Stress Questionnaire    Feeling of Stress: Not at all  Social  Connections: Socially Integrated (05/12/2024)   Social Connection and Isolation Panel    Frequency of Communication with Friends and Family: More than three times a week    Frequency of Social Gatherings with Friends and Family: More than three times a week    Attends Religious Services: More than 4 times per year    Active Member of [employer] or Organizations: No    Attends Engineer, structural: More than 4 times per year    Marital Status: Married  Catering manager Violence: Not At Risk (05/12/2024)   Humiliation, Afraid, Rape, and Kick questionnaire    Fear of Current or Ex-Partner: No    Emotionally Abused: No    Physically Abused: No    Sexually Abused: No    Review of Systems:  All other review of systems negative except as mentioned in the HPI.  Physical Exam: Vital signs in last 24 hours: BP 134/84   Pulse 66   Temp (!) 97.3 F (36.3 C)   Ht 5' 5 (1.651 m)   Wt 180 lb (81.6 kg)   SpO2 97%   BMI 29.95 kg/m  General:   Alert, NAD Lungs:  Clear .   Heart:  Regular rate and rhythm Abdomen:  Soft, nontender and nondistended. Neuro/Psych:  Alert and cooperative. Normal mood and affect. A and O x 3  Reviewed labs, radiology imaging, old records and pertinent past GI work up  Patient is appropriate for planned procedure(s) and anesthesia in an ambulatory setting   K. Veena Tyliek Timberman ,  MD [phone]

## 2024-06-09 ENCOUNTER — Telehealth: Payer: Self-pay

## 2024-06-09 NOTE — Telephone Encounter (Signed)
Attempted to reach patient for post-procedure f/u call. No answer. Left message for her to please not hesitate to call if she has questions/concerns regarding her care.

## 2024-06-10 LAB — SURGICAL PATHOLOGY

## 2024-06-11 ENCOUNTER — Ambulatory Visit: Payer: Self-pay | Admitting: Gastroenterology

## 2024-06-20 ENCOUNTER — Ambulatory Visit: Admitting: Student

## 2024-08-01 ENCOUNTER — Other Ambulatory Visit: Payer: Self-pay | Admitting: Medical

## 2024-09-05 ENCOUNTER — Other Ambulatory Visit: Payer: Self-pay | Admitting: Medical

## 2024-09-07 NOTE — Progress Notes (Unsigned)
 "  Subjective:    Patient ID: Megan Shah, female    DOB: [birth year], 69 y.o.   MRN: [id number]  No chief complaint on file.   HPI Discussed the use of AI scribe software for clinical note transcription with the patient, who gave verbal consent to proceed.  History of Present Illness [name] is a 69 year old female who presents with a chronic cough and elevated heart rate.  She has been experiencing a persistent cough for several months, described as an irritation rather than a sore throat. The cough is occasionally accompanied by pressure in her sinuses and head, though she does not feel like she has a sinus infection. No headaches are present, which she used to experience when consuming milk, but she has since stopped drinking it.  She has noticed an elevated heart rate, measuring around 100-101 bpm while at rest, first observed a few days ago. Her heart rate does not remain consistently high and can decrease to around 83 bpm. There is a family history of atrial fibrillation. She recently stopped taking creatine and B3 supplements, suspecting they might have contributed to her symptoms.  Her left hip is painful, affecting her sleeping position, and she suspects it may need replacement.  She has lost 35 pounds over the past year but has regained 25 pounds and is currently trying to lose weight again. She is practicing intermittent fasting and increasing her hydration and physical activity, walking a mile every morning.  Her husband, Ozell, has been experiencing pain and mobility issues following a stroke, which has been a source of stress for her.    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   Bunion of great toe of right foot 02/07/2016   Diverticulitis    History of chicken pox 06/07/2015   Hyperlipidemia    Hypertension    Left shoulder pain 12/25/2016   Overweight 06/07/2015   Palpitations 06/07/2015   Patient is Jehovah's Witness  06/07/2015   Post menopausal syndrome 06/13/2015   Preventative health care 12/25/2016   Right hip pain 06/13/2015   Thyroid  disease    Transfusion of blood product refused for religious reason 06/07/2015   Vitamin D  deficiency 06/07/2015    Past Surgical History:  Procedure Laterality Date   COLONOSCOPY     PARTIAL HIP ARTHROPLASTY     WISDOM TOOTH EXTRACTION  Age 32    Family History  Problem Relation Age of Onset   Colon polyps Mother    Colon cancer Mother    Arrhythmia Mother    Arthritis Mother    Hypertension Mother    Hyperlipidemia Mother    Heart disease Mother        CAD with stents, afib   Atrial fibrillation Mother        flutter   Colon polyps Father    Colon cancer Father    Arrhythmia Father    Arthritis Father        broken hip, sepsis    Cancer Father        colon cancer, mirkel cell carcinoma   Bunion Father    Dementia Father    Atrial fibrillation Father    Cancer Brother        kidney   Colon polyps Brother        non cancer polyp   Cancer Brother        lymphoma in remission   Atrial fibrillation Brother    Arthritis Maternal  Grandmother    Colon polyps Maternal Grandfather    Colon cancer Maternal Grandfather    Arthritis Maternal Grandfather    Cancer Maternal Grandfather 22       colon cancer   Arthritis Paternal Grandmother    Arthritis Paternal Grandfather    Cancer Paternal Grandfather        colon cancer   Esophageal cancer Neg Hx    Rectal cancer Neg Hx    Stomach cancer Neg Hx     Social History   Socioeconomic History   Marital status: Married    Spouse name: Not on file   Number of children: 0   Years of education: Not on file   Highest education level: 12th grade  Occupational History   Occupation: agricultural consultant   Occupation: Retired Education Officer, Environmental business  Tobacco Use   Smoking status: Former   Smokeless tobacco: Never   Tobacco comments:    Smoked for 2 years as a teenager.  Vaping Use   Vaping status: Never Used   Substance and Sexual Activity   Alcohol use: Yes    Comment: 1-2 drinks a week   Drug use: No   Sexual activity: Yes    Comment: lives with husband, moved from [place] , no dietary restrictions, minimizes wheat and dairy, volunteer work  Other Topics Concern   Not on file  Social History Narrative   Not on file   Social Drivers of Health   Tobacco Use: Medium Risk (06/06/2024)   Patient History    Smoking Tobacco Use: Former    Smokeless Tobacco Use: Never    Passive Exposure: Not on Actuary Strain: Low Risk (05/12/2024)   Overall Financial Resource Strain (CARDIA)    Difficulty of Paying Living Expenses: Not very hard  Food Insecurity: No Food Insecurity (05/12/2024)   Epic    Worried About Radiation Protection Practitioner of Food in the Last Year: Never true    Ran Out of Food in the Last Year: Never true  Transportation Needs: No Transportation Needs (05/12/2024)   Epic    Lack of Transportation (Medical): No    Lack of Transportation (Non-Medical): No  Physical Activity: Inactive (05/12/2024)   Exercise Vital Sign    Days of Exercise per Week: 0 days    Minutes of Exercise per Session: 0 min  Stress: No Stress Concern Present (05/12/2024)   [employer] of Occupational Health - Occupational Stress Questionnaire    Feeling of Stress: Not at all  Social Connections: Socially Integrated (05/12/2024)   Social Connection and Isolation Panel    Frequency of Communication with Friends and Family: More than three times a week    Frequency of Social Gatherings with Friends and Family: More than three times a week    Attends Religious Services: More than 4 times per year    Active Member of Clubs or Organizations: No    Attends Engineer, Structural: More than 4 times per year    Marital Status: Married  Catering Manager Violence: Not At Risk (05/12/2024)   Epic    Fear of Current or Ex-Partner: No    Emotionally Abused: No    Physically Abused: No    Sexually Abused: No   Depression (PHQ2-9): Low Risk (05/12/2024)   Depression (PHQ2-9)    PHQ-2 Score: 3  Alcohol Screen: Low Risk (05/12/2024)   Alcohol Screen    Last Alcohol Screening Score (AUDIT): 2  Housing: Low Risk (05/12/2024)   Epic    Unable  to Pay for Housing in the Last Year: No    Number of Times Moved in the Last Year: 0    Homeless in the Last Year: No  Utilities: Not At Risk (05/12/2024)   Epic    Threatened with loss of utilities: No  Health Literacy: Adequate Health Literacy (05/12/2024)   B1300 Health Literacy    Frequency of need for help with medical instructions: Never    Outpatient Medications Prior to Visit  Medication Sig Dispense Refill   amLODipine  (NORVASC ) 5 MG tablet TAKE 1 TABLET (5 MG TOTAL) BY MOUTH DAILY. 30 tablet 0   betamethasone  dipropionate 0.05 % lotion Apply topically 2 (two) times daily. 60 mL 0   Cholecalciferol 50 MCG (2000 UT) TABS Take 4,000 Units by mouth 2 (two) times daily.      DHEA 10 MG CAPS Take by mouth daily.     Fluocinolone Acetonide 0.01 % OIL several drops to affected area of the scalp bid x 2 weeks scalp Twice a day; Duration: 14 days bid x 2 weeks then prn     losartan  (COZAAR ) 50 MG tablet Take 1 tablet (50 mg total) by mouth daily. 90 tablet 1   Magnesium Citrate POWD Take 615 mg by mouth 2 (two) times daily.     Multiple Vitamins-Minerals (MULTIVITAMIN ADULT PO) Take 1 tablet by mouth daily.     mupirocin  ointment (BACTROBAN ) 2 % Apply 1 Application topically 2 (two) times daily. (Patient not taking: No sig reported) 22 g 0   Omega-3 Fatty Acids (FISH OIL PO) Take by mouth.     ondansetron  (ZOFRAN ) 4 MG tablet Take 1 tablet (4 mg total) by mouth as directed. Take one Zofran  4 mg tablet 30-60 minutes before each colonoscopy prep dose 4 tablet 0   Progesterone  POWD Progesterone  112.5mg - Take 2 capsules by mouth at bedtime 180 g 1   thyroid  (ARMOUR THYROID ) 60 MG tablet TAKE 1 TABLET DAILY BEFORE A MEAL 5 DAYS A WEEK, AND 1/2 TABLET BEFORE A MEAL THE  OTHER 2 DAYS 90 tablet 1   TURMERIC PO Take by mouth.     No facility-administered medications prior to visit.    Allergies[1]  Review of Systems  Constitutional:  Positive for malaise/fatigue. Negative for fever.  HENT:  Positive for congestion, sinus pain and sore throat.   Eyes:  Negative for blurred vision.  Respiratory:  Positive for cough and sputum production. Negative for shortness of breath.   Cardiovascular:  Positive for palpitations. Negative for chest pain and leg swelling.  Gastrointestinal:  Negative for abdominal pain, blood in stool and nausea.  Genitourinary:  Negative for dysuria and frequency.  Musculoskeletal:  Negative for falls.  Skin:  Negative for rash.  Neurological:  Negative for dizziness, loss of consciousness and headaches.  Endo/Heme/Allergies:  Negative for environmental allergies.  Psychiatric/Behavioral:  Positive for depression. The patient is nervous/anxious.        Objective:    Physical Exam Constitutional:      General: She is not in acute distress.    Appearance: Normal appearance. She is well-developed. She is not toxic-appearing.  HENT:     Head: Normocephalic and atraumatic.     Right Ear: External ear normal.     Left Ear: External ear normal.     Nose: Nose normal.     Mouth/Throat:     Comments: Cervical anterior lymphadenopathy L>R Eyes:     General:        Right eye: No  discharge.        Left eye: No discharge.     Conjunctiva/sclera: Conjunctivae normal.  Neck:     Thyroid : No thyromegaly.  Cardiovascular:     Rate and Rhythm: Normal rate and regular rhythm.     Heart sounds: Normal heart sounds. No murmur heard. Pulmonary:     Effort: Pulmonary effort is normal. No respiratory distress.     Breath sounds: Normal breath sounds.  Abdominal:     General: Bowel sounds are normal.     Palpations: Abdomen is soft.     Tenderness: There is no abdominal tenderness. There is no guarding.  Musculoskeletal:        General:  Normal range of motion.     Cervical back: Neck supple.  Lymphadenopathy:     Cervical: No cervical adenopathy.  Skin:    General: Skin is warm and dry.  Neurological:     Mental Status: She is alert and oriented to person, place, and time.  Psychiatric:        Mood and Affect: Mood normal.        Behavior: Behavior normal.        Thought Content: Thought content normal.        Judgment: Judgment normal.     There were no vitals taken for this visit. Wt Readings from Last 3 Encounters:  06/06/24 180 lb (81.6 kg)  05/12/24 183 lb (83 kg)  03/12/24 179 lb (81.2 kg)    Diabetic Foot Exam - Simple   No data filed    Lab Results  Component Value Date   WBC 5.7 07/02/2023   HGB 14.4 07/02/2023   HCT 42.9 07/02/2023   PLT 272.0 07/02/2023   GLUCOSE 87 07/02/2023   CHOL 234 (H) 07/02/2023   TRIG 108.0 07/02/2023   HDL 80.20 07/02/2023   LDLDIRECT 124.0 01/01/2023   LDLCALC 132 (H) 07/02/2023   ALT 17 07/02/2023   AST 21 07/02/2023   NA 136 07/02/2023   K 4.6 07/02/2023   CL 100 07/02/2023   CREATININE 0.65 07/02/2023   BUN 14 07/02/2023   CO2 26 07/02/2023   TSH 0.81 07/02/2023   INR 1.0 03/05/2022   HGBA1C 5.4 07/02/2023    Lab Results  Component Value Date   TSH 0.81 07/02/2023   Lab Results  Component Value Date   WBC 5.7 07/02/2023   HGB 14.4 07/02/2023   HCT 42.9 07/02/2023   MCV 90.9 07/02/2023   PLT 272.0 07/02/2023   Lab Results  Component Value Date   NA 136 07/02/2023   K 4.6 07/02/2023   CO2 26 07/02/2023   GLUCOSE 87 07/02/2023   BUN 14 07/02/2023   CREATININE 0.65 07/02/2023   BILITOT 0.8 07/02/2023   ALKPHOS 56 07/02/2023   AST 21 07/02/2023   ALT 17 07/02/2023   PROT 7.0 07/02/2023   ALBUMIN 4.8 07/02/2023   CALCIUM  10.1 07/02/2023   EGFR 89 04/04/2022   GFR 90.87 07/02/2023   Lab Results  Component Value Date   CHOL 234 (H) 07/02/2023   Lab Results  Component Value Date   HDL 80.20 07/02/2023   Lab Results  Component  Value Date   LDLCALC 132 (H) 07/02/2023   Lab Results  Component Value Date   TRIG 108.0 07/02/2023   Lab Results  Component Value Date   CHOLHDL 3 07/02/2023   Lab Results  Component Value Date   HGBA1C 5.4 07/02/2023       Assessment &  Plan:  Vitamin D  deficiency Assessment & Plan: Supplement and monitor    Primary hypertension Assessment & Plan: Well controlled, no changes to meds. Encouraged heart healthy diet such as the DASH diet and exercise as tolerated.     Mixed hyperlipidemia Assessment & Plan: Encourage heart healthy diet such as MIND or DASH diet, increase exercise, avoid trans fats, simple carbohydrates and processed foods, consider a krill or fish or flaxseed oil cap daily.     Hyperglycemia Assessment & Plan: hgba1c acceptable, minimize simple carbs. Increase exercise as tolerated.      Assessment and Plan Assessment & Plan Chronic cough Persisting for approximately 12 months, possibly due to a smoldering sinus infection. Differential includes sinusitis, ENT-related issues, or silent heartburn. - Prescribed antibiotic for suspected sinus infection - Ordered chest x-ray to rule out other causes - Referred to ENT for further evaluation if symptoms persist - Will consider GI referral if ENT evaluation suggests silent heartburn  Lymphadenopathy Possible lymphadenitis related to sinus infection. No significant pain or soreness, but irritation present. - Ordered ultrasound of the neck to evaluate lymphadenopathy  Tachycardia Intermittent tachycardia with heart rate reaching 101 bpm while at rest. Possible relation to supplements taken, including creatine and B3. No immediate concern for atrial fibrillation. - Ordered EKG to assess cardiac function, EKG WNL - Monitor heart rate and symptoms  Primary hypertension Blood pressure well-controlled at 125/70 mmHg. No immediate concerns related to hypertension.  General Health Maintenance Discussion of  weight management and lifestyle modifications, including intermittent fasting and exercise, to reduce inflammation and improve overall health. - Encouraged continuation of intermittent fasting and regular exercise - Advised on maintaining hydration and a clean diet  Recording duration: 28 minutes     Harlene Horton, MD    [1] No Known Allergies  "

## 2024-09-07 NOTE — Assessment & Plan Note (Signed)
 Well controlled, no changes to meds. Encouraged heart healthy diet such as the DASH diet and exercise as tolerated.

## 2024-09-07 NOTE — Assessment & Plan Note (Signed)
 Supplement and monitor

## 2024-09-07 NOTE — Assessment & Plan Note (Signed)
 Encourage heart healthy diet such as MIND or DASH diet, increase exercise, avoid trans fats, simple carbohydrates and processed foods, consider a krill or fish or flaxseed oil cap daily.

## 2024-09-07 NOTE — Assessment & Plan Note (Signed)
 hgba1c acceptable, minimize simple carbs. Increase exercise as tolerated.

## 2024-09-11 ENCOUNTER — Ambulatory Visit (HOSPITAL_BASED_OUTPATIENT_CLINIC_OR_DEPARTMENT_OTHER)
Admission: RE | Admit: 2024-09-11 | Discharge: 2024-09-11 | Disposition: A | Discharge status: Home / Self Care | Source: Ambulatory Visit | Attending: Family Medicine | Admitting: Family Medicine

## 2024-09-11 ENCOUNTER — Encounter: Payer: Self-pay | Admitting: Family Medicine

## 2024-09-11 ENCOUNTER — Ambulatory Visit: Admitting: Family Medicine

## 2024-09-11 VITALS — BP 138/86 | HR 83 | Temp 97.7°F | Resp 16 | Ht 65.0 in

## 2024-09-11 DIAGNOSIS — I10 Essential (primary) hypertension: Secondary | ICD-10-CM | POA: Diagnosis not present

## 2024-09-11 DIAGNOSIS — E559 Vitamin D deficiency, unspecified: Secondary | ICD-10-CM

## 2024-09-11 DIAGNOSIS — R000 Tachycardia, unspecified: Secondary | ICD-10-CM | POA: Diagnosis not present

## 2024-09-11 DIAGNOSIS — R739 Hyperglycemia, unspecified: Secondary | ICD-10-CM | POA: Diagnosis not present

## 2024-09-11 DIAGNOSIS — E782 Mixed hyperlipidemia: Secondary | ICD-10-CM | POA: Diagnosis not present

## 2024-09-11 DIAGNOSIS — R053 Chronic cough: Secondary | ICD-10-CM

## 2024-09-11 DIAGNOSIS — R591 Generalized enlarged lymph nodes: Secondary | ICD-10-CM

## 2024-09-11 MED ORDER — CEFDINIR 300 MG PO CAPS: 300.0000 mg | ORAL_CAPSULE | Freq: Two times a day (BID) | ORAL | 0 refills | Status: AC

## 2024-09-11 NOTE — Patient Instructions (Signed)

## 2024-09-18 ENCOUNTER — Other Ambulatory Visit: Payer: Self-pay | Admitting: Family Medicine

## 2024-09-18 ENCOUNTER — Ambulatory Visit: Payer: Self-pay | Admitting: Family Medicine

## 2024-09-18 DIAGNOSIS — I10 Essential (primary) hypertension: Secondary | ICD-10-CM

## 2024-09-18 MED ORDER — LOSARTAN POTASSIUM 50 MG PO TABS: 50.0000 mg | ORAL_TABLET | Freq: Every day | ORAL | 1 refills | Status: AC

## 2024-09-18 NOTE — Telephone Encounter (Signed)
 Copied from CRM [id number]. Topic: Clinical - Medication Refill >> Sep 18, 2024  9:44 AM Tiffini S wrote: Medication: losartan  (COZAAR ) 50 MG tablet  Has the patient contacted their pharmacy? Yes (Agent: If no, request that the patient contact the pharmacy for the refill. If patient does not wish to contact the pharmacy document the reason why and proceed with request.) (Agent: If yes, when and what did the pharmacy advise?)  This is the patient's preferred pharmacy:    [id number] - [address] [place] [address] Phone: [phone] Fax: [phone]  Is this the correct pharmacy for this prescription? Yes If no, delete pharmacy and type the correct one.   Has the prescription been filled recently? Yes  Is the patient out of the medication? Yes, will be out of the medication in three days/ going out of town tomorrow- need today   Has the patient been seen for an appointment in the last year OR does the patient have an upcoming appointment? Yes  Can we respond through MyChart? Yes  Agent: Please be advised that Rx refills may take up to 3 business days. We ask that you follow-up with your pharmacy.

## 2024-09-26 ENCOUNTER — Encounter: Payer: Self-pay | Admitting: Family

## 2024-09-26 ENCOUNTER — Other Ambulatory Visit: Payer: Self-pay | Admitting: Family

## 2024-09-26 ENCOUNTER — Telehealth: Payer: Self-pay | Admitting: Family Medicine

## 2024-09-26 DIAGNOSIS — I10 Essential (primary) hypertension: Secondary | ICD-10-CM

## 2024-09-26 MED ORDER — AMLODIPINE BESYLATE 5 MG PO TABS: 5.0000 mg | ORAL_TABLET | Freq: Every day | ORAL | 0 refills | Status: DC

## 2024-09-26 NOTE — Telephone Encounter (Unsigned)
 Copied from CRM #[id number]. 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

## 2024-09-30 ENCOUNTER — Other Ambulatory Visit: Payer: Self-pay | Admitting: Family Medicine

## 2024-09-30 ENCOUNTER — Other Ambulatory Visit

## 2024-09-30 DIAGNOSIS — Z1231 Encounter for screening mammogram for malignant neoplasm of breast: Secondary | ICD-10-CM

## 2024-10-02 ENCOUNTER — Ambulatory Visit
Admission: RE | Admit: 2024-10-02 | Discharge: 2024-10-02 | Disposition: A | Discharge status: Home / Self Care | Source: Ambulatory Visit | Attending: Family Medicine | Admitting: Family Medicine

## 2024-10-02 ENCOUNTER — Other Ambulatory Visit

## 2024-10-02 DIAGNOSIS — Z1231 Encounter for screening mammogram for malignant neoplasm of breast: Secondary | ICD-10-CM | POA: Diagnosis present

## 2024-10-03 ENCOUNTER — Ambulatory Visit

## 2024-10-03 ENCOUNTER — Other Ambulatory Visit

## 2024-10-04 ENCOUNTER — Other Ambulatory Visit: Payer: Self-pay | Admitting: Medical

## 2024-10-04 DIAGNOSIS — I10 Essential (primary) hypertension: Secondary | ICD-10-CM

## 2024-10-09 ENCOUNTER — Ambulatory Visit
Admission: RE | Admit: 2024-10-09 | Discharge: 2024-10-09 | Disposition: A | Source: Ambulatory Visit | Attending: Family Medicine | Admitting: Family Medicine

## 2024-10-09 DIAGNOSIS — R591 Generalized enlarged lymph nodes: Secondary | ICD-10-CM

## 2024-10-10 ENCOUNTER — Other Ambulatory Visit: Payer: Self-pay

## 2024-10-10 DIAGNOSIS — I10 Essential (primary) hypertension: Secondary | ICD-10-CM

## 2024-10-10 MED ORDER — AMLODIPINE BESYLATE 5 MG PO TABS: 5.0000 mg | ORAL_TABLET | Freq: Every day | ORAL | 0 refills | Status: AC

## 2024-10-22 ENCOUNTER — Encounter

## 2024-11-20 ENCOUNTER — Ambulatory Visit: Admitting: Family Medicine

## 2024-12-15 ENCOUNTER — Ambulatory Visit: Admitting: Family Medicine

## 2025-03-23 ENCOUNTER — Encounter: Admitting: Family Medicine

## 2025-05-15 ENCOUNTER — Ambulatory Visit
